# Patient Record
Sex: Female | Born: 1955 | Hispanic: No | Marital: Married | Smoking: Never smoker
Health system: Southern US, Community
[De-identification: ages and names within clinical notes are randomized; demographics above are authoritative.]

## PROBLEM LIST (undated history)

## (undated) DIAGNOSIS — I10 Essential (primary) hypertension: Secondary | ICD-10-CM

---

## 1998-02-03 ENCOUNTER — Other Ambulatory Visit: Admission: RE | Admit: 1998-02-03 | Discharge: 1998-02-03 | Payer: Self-pay | Admitting: *Deleted

## 2013-08-11 ENCOUNTER — Ambulatory Visit (INDEPENDENT_AMBULATORY_CARE_PROVIDER_SITE_OTHER): Payer: BC Managed Care – PPO | Admitting: Family Medicine

## 2013-08-11 VITALS — BP 198/102 | HR 74 | Temp 98.4°F | Resp 18 | Ht 62.5 in | Wt 126.0 lb

## 2013-08-11 DIAGNOSIS — E781 Pure hyperglyceridemia: Secondary | ICD-10-CM

## 2013-08-11 DIAGNOSIS — R82998 Other abnormal findings in urine: Secondary | ICD-10-CM

## 2013-08-11 DIAGNOSIS — I10 Essential (primary) hypertension: Secondary | ICD-10-CM

## 2013-08-11 DIAGNOSIS — D251 Intramural leiomyoma of uterus: Secondary | ICD-10-CM

## 2013-08-11 LAB — POCT UA - MICROSCOPIC ONLY
Casts, Ur, LPF, POC: NEGATIVE
Crystals, Ur, HPF, POC: NEGATIVE
Mucus, UA: NEGATIVE
Yeast, UA: NEGATIVE

## 2013-08-11 LAB — POCT URINALYSIS DIPSTICK
Bilirubin, UA: NEGATIVE
Blood, UA: NEGATIVE
Glucose, UA: NEGATIVE
Ketones, UA: NEGATIVE
Nitrite, UA: NEGATIVE
Protein, UA: NEGATIVE
Spec Grav, UA: 1.015
Urobilinogen, UA: 0.2
pH, UA: 7

## 2013-08-11 LAB — LIPID PANEL
Cholesterol: 208 mg/dL — ABNORMAL HIGH (ref 0–200)
HDL: 40 mg/dL (ref >39)
LDL Cholesterol: 113 mg/dL — ABNORMAL HIGH (ref 0–99)
Total CHOL/HDL Ratio: 5.2 Ratio
Triglycerides: 273 mg/dL — ABNORMAL HIGH (ref <150)
VLDL: 55 mg/dL — ABNORMAL HIGH (ref 0–40)

## 2013-08-11 LAB — COMPREHENSIVE METABOLIC PANEL
ALT: 21 U/L (ref 0–35)
AST: 22 U/L (ref 0–37)
Albumin: 4.5 g/dL (ref 3.5–5.2)
Alkaline Phosphatase: 78 U/L (ref 39–117)
BUN: 9 mg/dL (ref 6–23)
CO2: 26 mEq/L (ref 19–32)
Calcium: 8.7 mg/dL (ref 8.4–10.5)
Chloride: 104 mEq/L (ref 96–112)
Creat: 0.66 mg/dL (ref 0.50–1.10)
Glucose, Bld: 89 mg/dL (ref 70–99)
Potassium: 3.9 mEq/L (ref 3.5–5.3)
Sodium: 138 mEq/L (ref 135–145)
Total Bilirubin: 0.6 mg/dL (ref 0.2–1.2)
Total Protein: 7.7 g/dL (ref 6.0–8.3)

## 2013-08-11 LAB — POCT CBC
Granulocyte percent: 74.2 %G (ref 37–80)
HCT, POC: 40.4 % (ref 37.7–47.9)
Hemoglobin: 13 g/dL (ref 12.2–16.2)
Lymph, poc: 1 (ref 0.6–3.4)
MCH, POC: 29.4 pg (ref 27–31.2)
MCHC: 32.2 g/dL (ref 31.8–35.4)
MCV: 91.4 fL (ref 80–97)
MID (cbc): 0.3 (ref 0–0.9)
MPV: 9.5 fL (ref 0–99.8)
POC Granulocyte: 3.7 (ref 2–6.9)
POC LYMPH PERCENT: 20.2 %L (ref 10–50)
POC MID %: 5.6 %M (ref 0–12)
Platelet Count, POC: 276 10*3/uL (ref 142–424)
RBC: 4.42 M/uL (ref 4.04–5.48)
RDW, POC: 12.9 %
WBC: 5 10*3/uL (ref 4.6–10.2)

## 2013-08-11 LAB — TSH: TSH: 2.112 u[IU]/mL (ref 0.350–4.500)

## 2013-08-11 NOTE — Patient Instructions (Signed)
Hypertriglyceridemia  Diet for High blood levels of Triglycerides Most fats in food are triglycerides. Triglycerides in your blood are stored as fat in your body. High levels of triglycerides in your blood may put you at a greater risk for heart disease and stroke.  Normal triglyceride levels are less than 150 mg/dL. Borderline high levels are 150-199 mg/dl. High levels are 200 - 499 mg/dL, and very high triglyceride levels are greater than 500 mg/dL. The decision to treat high triglycerides is generally based on the level. For people with borderline or high triglyceride levels, treatment includes weight loss and exercise. Drugs are recommended for people with very high triglyceride levels. Many people who need treatment for high triglyceride levels have metabolic syndrome. This syndrome is a collection of disorders that often include: insulin resistance, high blood pressure, blood clotting problems, high cholesterol and triglycerides. TESTING PROCEDURE FOR TRIGLYCERIDES  You should not eat 4 hours before getting your triglycerides measured. The normal range of triglycerides is between 10 and 250 milligrams per deciliter (mg/dl). Some people may have extreme levels (1000 or above), but your triglyceride level may be too high if it is above 150 mg/dl, depending on what other risk factors you have for heart disease.  People with high blood triglycerides may also have high blood cholesterol levels. If you have high blood cholesterol as well as high blood triglycerides, your risk for heart disease is probably greater than if you only had high triglycerides. High blood cholesterol is one of the main risk factors for heart disease. CHANGING YOUR DIET  Your weight can affect your blood triglyceride level. If you are more than 20% above your ideal body weight, you may be able to lower your blood triglycerides by losing weight. Eating less and exercising regularly is the best way to combat this. Fat provides more  calories than any other food. The best way to lose weight is to eat less fat. Only 30% of your total calories should come from fat. Less than 7% of your diet should come from saturated fat. A diet low in fat and saturated fat is the same as a diet to decrease blood cholesterol. By eating a diet lower in fat, you may lose weight, lower your blood cholesterol, and lower your blood triglyceride level.  Eating a diet low in fat, especially saturated fat, may also help you lower your blood triglyceride level. Ask your dietitian to help you figure how much fat you can eat based on the number of calories your caregiver has prescribed for you.  Exercise, in addition to helping with weight loss may also help lower triglyceride levels.   Alcohol can increase blood triglycerides. You may need to stop drinking alcoholic beverages.  Too much carbohydrate in your diet may also increase your blood triglycerides. Some complex carbohydrates are necessary in your diet. These may include bread, rice, potatoes, other starchy vegetables and cereals.  Reduce "simple" carbohydrates. These may include pure sugars, candy, honey, and jelly without losing other nutrients. If you have the kind of high blood triglycerides that is affected by the amount of carbohydrates in your diet, you will need to eat less sugar and less high-sugar foods. Your caregiver can help you with this.  Adding 2-4 grams of fish oil (EPA+ DHA) may also help lower triglycerides. Speak with your caregiver before adding any supplements to your regimen. Following the Diet  Maintain your ideal weight. Your caregivers can help you with a diet. Generally, eating less food and getting more   exercise will help you lose weight. Joining a weight control group may also help. Ask your caregivers for a good weight control group in your area.  Eat low-fat foods instead of high-fat foods. This can help you lose weight too.  These foods are lower in fat. Eat MORE of these:    Dried beans, peas, and lentils.  Egg whites.  Low-fat cottage cheese.  Fish.  Lean cuts of meat, such as round, sirloin, rump, and flank (cut extra fat off meat you fix).  Whole grain breads, cereals and pasta.  Skim and nonfat dry milk.  Low-fat yogurt.  Poultry without the skin.  Cheese made with skim or part-skim milk, such as mozzarella, parmesan, farmers', ricotta, or pot cheese. These are higher fat foods. Eat LESS of these:   Whole milk and foods made from whole milk, such as American, blue, cheddar, monterey jack, and swiss cheese  High-fat meats, such as luncheon meats, sausages, knockwurst, bratwurst, hot dogs, ribs, corned beef, ground pork, and regular ground beef.  Fried foods. Limit saturated fats in your diet. Substituting unsaturated fat for saturated fat may decrease your blood triglyceride level. You will need to read package labels to know which products contain saturated fats.  These foods are high in saturated fat. Eat LESS of these:   Fried pork skins.  Whole milk.  Skin and fat from poultry.  Palm oil.  Butter.  Shortening.  Cream cheese.  Bacon.  Margarines and baked goods made from listed oils.  Vegetable shortenings.  Chitterlings.  Fat from meats.  Coconut oil.  Palm kernel oil.  Lard.  Cream.  Sour cream.  Fatback.  Coffee whiteners and non-dairy creamers made with these oils.  Cheese made from whole milk. Use unsaturated fats (both polyunsaturated and monounsaturated) moderately. Remember, even though unsaturated fats are better than saturated fats; you still want a diet low in total fat.  These foods are high in unsaturated fat:   Canola oil.  Sunflower oil.  Mayonnaise.  Almonds.  Peanuts.  Pine nuts.  Margarines made with these oils.  Safflower oil.  Olive oil.  Avocados.  Cashews.  Peanut butter.  Sunflower seeds.  Soybean oil.  Peanut  oil.  Olives.  Pecans.  Walnuts.  Pumpkin seeds. Avoid sugar and other high-sugar foods. This will decrease carbohydrates without decreasing other nutrients. Sugar in your food goes rapidly to your blood. When there is excess sugar in your blood, your liver may use it to make more triglycerides. Sugar also contains calories without other important nutrients.  Eat LESS of these:   Sugar, brown sugar, powdered sugar, jam, jelly, preserves, honey, syrup, molasses, pies, candy, cakes, cookies, frosting, pastries, colas, soft drinks, punches, fruit drinks, and regular gelatin.  Avoid alcohol. Alcohol, even more than sugar, may increase blood triglycerides. In addition, alcohol is high in calories and low in nutrients. Ask for sparkling water, or a diet soft drink instead of an alcoholic beverage. Suggestions for planning and preparing meals   Bake, broil, grill or roast meats instead of frying.  Remove fat from meats and skin from poultry before cooking.  Add spices, herbs, lemon juice or vinegar to vegetables instead of salt, rich sauces or gravies.  Use a non-stick skillet without fat or use no-stick sprays.  Cool and refrigerate stews and broth. Then remove the hardened fat floating on the surface before serving.  Refrigerate meat drippings and skim off fat to make low-fat gravies.  Serve more fish.  Use less butter,   margarine and other high-fat spreads on bread or vegetables.  Use skim or reconstituted non-fat dry milk for cooking.  Cook with low-fat cheeses.  Substitute low-fat yogurt or cottage cheese for all or part of the sour cream in recipes for sauces, dips or congealed salads.  Use half yogurt/half mayonnaise in salad recipes.  Substitute evaporated skim milk for cream. Evaporated skim milk or reconstituted non-fat dry milk can be whipped and substituted for whipped cream in certain recipes.  Choose fresh fruits for dessert instead of high-fat foods such as pies or  cakes. Fruits are naturally low in fat. When Dining Out   Order low-fat appetizers such as fruit or vegetable juice, pasta with vegetables or tomato sauce.  Select clear, rather than cream soups.  Ask that dressings and gravies be served on the side. Then use less of them.  Order foods that are baked, broiled, poached, steamed, stir-fried, or roasted.  Ask for margarine instead of butter, and use only a small amount.  Drink sparkling water, unsweetened tea or coffee, or diet soft drinks instead of alcohol or other sweet beverages. QUESTIONS AND ANSWERS ABOUT OTHER FATS IN THE BLOOD: SATURATED FAT, TRANS FAT, AND CHOLESTEROL What is trans fat? Trans fat is a type of fat that is formed when vegetable oil is hardened through a process called hydrogenation. This process helps makes foods more solid, gives them shape, and prolongs their shelf life. Trans fats are also called hydrogenated or partially hydrogenated oils.  What do saturated fat, trans fat, and cholesterol in foods have to do with heart disease? Saturated fat, trans fat, and cholesterol in the diet all raise the level of LDL "bad" cholesterol in the blood. The higher the LDL cholesterol, the greater the risk for coronary heart disease (CHD). Saturated fat and trans fat raise LDL similarly.  What foods contain saturated fat, trans fat, and cholesterol? High amounts of saturated fat are found in animal products, such as fatty cuts of meat, chicken skin, and full-fat dairy products like butter, whole milk, cream, and cheese, and in tropical vegetable oils such as palm, palm kernel, and coconut oil. Trans fat is found in some of the same foods as saturated fat, such as vegetable shortening, some margarines (especially hard or stick margarine), crackers, cookies, baked goods, fried foods, salad dressings, and other processed foods made with partially hydrogenated vegetable oils. Small amounts of trans fat also occur naturally in some animal  products, such as milk products, beef, and lamb. Foods high in cholesterol include liver, other organ meats, egg yolks, shrimp, and full-fat dairy products. How can I use the new food label to make heart-healthy food choices? Check the Nutrition Facts panel of the food label. Choose foods lower in saturated fat, trans fat, and cholesterol. For saturated fat and cholesterol, you can also use the Percent Daily Value (%DV): 5% DV or less is low, and 20% DV or more is high. (There is no %DV for trans fat.) Use the Nutrition Facts panel to choose foods low in saturated fat and cholesterol, and if the trans fat is not listed, read the ingredients and limit products that list shortening or hydrogenated or partially hydrogenated vegetable oil, which tend to be high in trans fat. POINTS TO REMEMBER:   Discuss your risk for heart disease with your caregivers, and take steps to reduce risk factors.  Change your diet. Choose foods that are low in saturated fat, trans fat, and cholesterol.  Add exercise to your daily routine if   it is not already being done. Participate in physical activity of moderate intensity, like brisk walking, for at least 30 minutes on most, and preferably all days of the week. No time? Break the 30 minutes into three, 10-minute segments during the day.  Stop smoking. If you do smoke, contact your caregiver to discuss ways in which they can help you quit.  Do not use street drugs.  Maintain a normal weight.  Maintain a healthy blood pressure.  Keep up with your blood work for checking the fats in your blood as directed by your caregiver. Document Released: 01/28/2004 Document Revised: 10/11/2011 Document Reviewed: 08/25/2008 [facility] Patient Information 2014 [place]. Uterine Fibroid A uterine fibroid is a growth (tumor) that occurs in your uterus. This type of tumor is not cancerous and does not spread out of the uterus. You can have one or many fibroids. Fibroids can vary in  size, weight, and where they grow in the uterus. Some can become quite large. Most fibroids do not require medical treatment, but some can cause pain or heavy bleeding during and between periods. CAUSES  A fibroid is the result of a single uterine cell that keeps growing (unregulated), which is different than most cells in the human body. Most cells have a control mechanism that keeps them from reproducing without control.  SIGNS AND SYMPTOMS   Bleeding.  Pelvic pain and pressure.  Bladder problems due to the size of the fibroid.  Infertility and miscarriages depending on the size and location of the fibroid. DIAGNOSIS  Uterine fibroids are diagnosed through a physical exam. Your health care provider may feel the lumpy tumors during a pelvic exam. Ultrasonography may be done to get information regarding size, location, and number of tumors.  TREATMENT   Your health care provider may recommend watchful waiting. This involves getting the fibroid checked by your health care provider to see if it grows or shrinks.   Hormone treatment or an intrauterine device (IUD) may be prescribed.   Surgery may be needed to remove the fibroids (myomectomy) or the uterus (hysterectomy). This depends on your situation. When fibroids interfere with fertility and a woman wants to become pregnant, a health care provider may recommend having the fibroids removed.  [place] care depends on how you were treated. In general:   Keep all follow-up appointments with your health care provider.   Only take over-the-counter or prescription medicines as directed by your health care provider. If you were prescribed a hormone treatment, take the hormone medicines exactly as directed. Do not take aspirin. It can cause bleeding.   Talk to your health care provider about taking iron pills.  If your periods are troublesome but not so heavy, lie down with your feet raised slightly above your heart.  Place cold packs on your lower abdomen.   If your periods are heavy, write down the number of pads or tampons you use per month. Bring this information to your health care provider.   Include green vegetables in your diet.  SEEK IMMEDIATE MEDICAL CARE IF:  You have pelvic pain or cramps not controlled with medicines.   You have a sudden increase in pelvic pain.   You have an increase in bleeding between and during periods.   You have excessive periods and soak tampons or pads in a half hour or less.  You feel lightheaded or have fainting episodes. Document Released: 04/08/2000 Document Revised: 01/30/2013 Document Reviewed: 11/08/2012 [facility] Patient Information 2014 [place], [place].  Hypertension As your heart beats, it forces blood through your arteries. This force is your blood pressure. If the pressure is too high, it is called hypertension (HTN) or high blood pressure. HTN is dangerous because you may have it and not know it. High blood pressure may mean that your heart has to work harder to pump blood. Your arteries may be narrow or stiff. The extra work puts you at risk for heart disease, stroke, and other problems.  Blood pressure consists of two numbers, a higher number over a lower, 110/72, for example. It is stated as "110 over 72." The ideal is below 120 for the top number (systolic) and under 80 for the bottom (diastolic). Write down your blood pressure today. You should pay close attention to your blood pressure if you have certain conditions such as:  Heart failure.  Prior heart attack.  Diabetes  Chronic kidney disease.  Prior stroke.  Multiple risk factors for heart disease. To see if you have HTN, your blood pressure should be measured while you are seated with your arm held at the level of the heart. It should be measured at least twice. A one-time elevated blood pressure reading (especially in the Emergency Department) does not mean that you need treatment.  There may be conditions in which the blood pressure is different between your right and left arms. It is important to see your caregiver soon for a recheck. Most people have essential hypertension which means that there is not a specific cause. This type of high blood pressure may be lowered by changing lifestyle factors such as:  Stress.  Smoking.  Lack of exercise.  Excessive weight.  Drug/tobacco/alcohol use.  Eating less salt. Most people do not have symptoms from high blood pressure until it has caused damage to the body. Effective treatment can often prevent, delay or reduce that damage. TREATMENT  When a cause has been identified, treatment for high blood pressure is directed at the cause. There are a large number of medications to treat HTN. These fall into several categories, and your caregiver will help you select the medicines that are best for you. Medications may have side effects. You should review side effects with your caregiver. If your blood pressure stays high after you have made lifestyle changes or started on medicines,   Your medication(s) may need to be changed.  Other problems may need to be addressed.  Be certain you understand your prescriptions, and know how and when to take your medicine.  Be sure to follow up with your caregiver within the time frame advised (usually within two weeks) to have your blood pressure rechecked and to review your medications.  If you are taking more than one medicine to lower your blood pressure, make sure you know how and at what times they should be taken. Taking two medicines at the same time can result in blood pressure that is too low. SEEK IMMEDIATE MEDICAL CARE IF:  You develop a severe headache, blurred or changing vision, or confusion.  You have unusual weakness or numbness, or a faint feeling.  You have severe chest or abdominal pain, vomiting, or breathing problems. MAKE SURE YOU:   Understand these  instructions.  Will watch your condition.  Will get help right away if you are not doing well or get worse. Document Released: 04/11/2005 Document Revised: 07/04/2011 Document Reviewed: 11/30/2007 [facility] Patient Information 2014 [place].

## 2013-08-11 NOTE — Progress Notes (Signed)
Is a 58 year old woman works in a [employer] business over in [place]. Went to a health screening at her [employer] and found that her blood pressure was high and her triglycerides are high.  Patient's had problems with anemia in the past which was attributed to fibroids. She no longer has periods now.  Patient is totally asymptomatic: No chest pain or shortness of breath, no swelling of her ankles.  Objective: HEENT unremarkable We have a language barrier and patient's preacher is helping to translate although his English is not perfect either Chest: Clear Heart: Regular with 1/6 systolic ejection type murmur Neck: Supple no thyromegaly or adenopathy, no bruits Abdomen: No bruits, fibroids are palpable to the level of the umbilicus and are irregular. There is no HSM. Present tenderness.  Extremities: No edema  Blood pressure recheck is 180/90. Patient brings in a number of blood pressure readings most of which are normal and are taken by herself at home. Nevertheless the screening at her church showed a blood pressure of 200/110  Assessment: Patient probably has hypertension. At this point is not having any symptoms and does not seem life-threatening so that we can wait on labs before we start putting her on medication.  Signed, Robyn Haber

## 2013-08-13 ENCOUNTER — Ambulatory Visit (INDEPENDENT_AMBULATORY_CARE_PROVIDER_SITE_OTHER): Payer: BC Managed Care – PPO | Admitting: Family Medicine

## 2013-08-13 VITALS — BP 162/100 | HR 71 | Temp 98.2°F | Resp 17 | Ht 63.0 in | Wt 123.0 lb

## 2013-08-13 DIAGNOSIS — IMO0001 Reserved for inherently not codable concepts without codable children: Secondary | ICD-10-CM

## 2013-08-13 DIAGNOSIS — R8281 Pyuria: Secondary | ICD-10-CM

## 2013-08-13 DIAGNOSIS — N390 Urinary tract infection, site not specified: Secondary | ICD-10-CM

## 2013-08-13 MED ORDER — CIPROFLOXACIN HCL 250 MG PO TABS: 250.0000 mg | ORAL_TABLET | Freq: Two times a day (BID) | ORAL | Status: DC

## 2013-08-13 NOTE — Patient Instructions (Signed)
Results for orders placed in visit on 08/11/13  COMPREHENSIVE METABOLIC PANEL      Result Value Ref Range   Sodium 138  135 - 145 mEq/L   Potassium 3.9  3.5 - 5.3 mEq/L   Chloride 104  96 - 112 mEq/L   CO2 26  19 - 32 mEq/L   Glucose, Bld 89  70 - 99 mg/dL   BUN 9  6 - 23 mg/dL   Creat 0.66  0.50 - 1.10 mg/dL   Total Bilirubin 0.6  0.2 - 1.2 mg/dL   Alkaline Phosphatase 78  39 - 117 U/L   AST 22  0 - 37 U/L   ALT 21  0 - 35 U/L   Total Protein 7.7  6.0 - 8.3 g/dL   Albumin 4.5  3.5 - 5.2 g/dL   Calcium 8.7  8.4 - 10.5 mg/dL  LIPID PANEL      Result Value Ref Range   Cholesterol 208 (*) 0 - 200 mg/dL   Triglycerides 273 (*) <150 mg/dL   HDL 40  >39 mg/dL   Total CHOL/HDL Ratio 5.2     VLDL 55 (*) 0 - 40 mg/dL   LDL Cholesterol 113 (*) 0 - 99 mg/dL  TSH      Result Value Ref Range   TSH 2.112  0.350 - 4.500 uIU/mL  POCT CBC      Result Value Ref Range   WBC 5.0  4.6 - 10.2 K/uL   Lymph, poc 1.0  0.6 - 3.4   POC LYMPH PERCENT 20.2  10 - 50 %L   MID (cbc) 0.3  0 - 0.9   POC MID % 5.6  0 - 12 %M   POC Granulocyte 3.7  2 - 6.9   Granulocyte percent 74.2  37 - 80 %G   RBC 4.42  4.04 - 5.48 M/uL   Hemoglobin 13.0  12.2 - 16.2 g/dL   HCT, POC 40.4  37.7 - 47.9 %   MCV 91.4  80 - 97 fL   MCH, POC 29.4  27 - 31.2 pg   MCHC 32.2  31.8 - 35.4 g/dL   RDW, POC 12.9     Platelet Count, POC 276  142 - 424 K/uL   MPV 9.5  0 - 99.8 fL  POCT UA - MICROSCOPIC ONLY      Result Value Ref Range   WBC, Ur, HPF, POC 3-13     RBC, urine, microscopic 0-2     Bacteria, U Microscopic trace     Mucus, UA neg     Epithelial cells, urine per micros 1-4     Crystals, Ur, HPF, POC neg     Casts, Ur, LPF, POC neg     Yeast, UA neg    POCT URINALYSIS DIPSTICK      Result Value Ref Range   Color, UA lt yellow     Clarity, UA clear     Glucose, UA neg     Bilirubin, UA neg     Ketones, UA neg     Spec Grav, UA 1.015     Blood, UA neg     pH, UA 7.0     Protein, UA neg     Urobilinogen, UA  0.2     Nitrite, UA neg     Leukocytes, UA small (1+)

## 2013-08-13 NOTE — Progress Notes (Addendum)
This chart was scribed for Robyn Haber, MD by Vernell Barrier, Medical Scribe. This patient's care was started at 8:26 PM.  Patient ID: Molly Richards MRN: [id number], DOB: [birth year], 58 y.o. Date of Encounter: 08/13/2013, 8:26 PM  Primary Physician: No primary provider on file.  Chief Complaint: HTN follow up  HPI: 58 y.o. year old female with history below presents for hypertension follow up. Seen by Dr. Joseph Art on 08/11/13 following a health screening. Cholesterol on blood test 2 days ago was slightly elevated but not high enough to require treatment.  BP today 162/100. Today says she feels okay. States she checked BP twice today and both times were in 476-546 range systolic.   Diet consists of brown rice and green vegetables. No CP, HA, visual changes, or focal deficits.   No past medical history on file.   Home Meds: Prior to Admission medications   Not on File    Allergies: No Known Allergies  History   Social History   Marital Status: Unknown    Spouse Name: N/A    Number of Children: N/A   Years of Education: N/A   Occupational History   Not on file.   Social History Main Topics   Smoking status: Never Smoker    Smokeless tobacco: Never Used   Alcohol Use: No   Drug Use: No   Sexual Activity: Not on file   Other Topics Concern   Not on file   Social History Narrative   No narrative on file     Family History  Problem Relation Age of Onset   Hypertension Mother     Review of Systems: Constitutional: negative for chills, fever, night sweats, weight changes, or fatigue  HEENT: negative for vision changes, hearing loss, congestion, rhinorrhea, ST, epistaxis, or sinus pressure Cardiovascular: negative for chest pain, palpitations, or DOE Respiratory: negative for hemoptysis, wheezing, shortness of breath, or cough Abdominal: negative for abdominal pain, nausea, vomiting, diarrhea, or constipation Dermatological: negative for rash Neurologic:  negative for headache, dizziness, or syncope All other systems reviewed and are otherwise negative with the exception to those above and in the HPI.   Physical Exam: Blood pressure 162/100, pulse 71, temperature 98.2 F (36.8 C), temperature source Oral, resp. rate 17, height 5\' 3"  (1.6 m), weight 123 lb (55.792 kg), SpO2 97.00%., Body mass index is 21.79 kg/(m^2). General: Well developed, well nourished, in no acute distress. Head: Normocephalic, atraumatic, eyes without discharge, sclera non-icteric, nares are without discharge. Bilateral auditory canals clear, TM's are without perforation, pearly grey and translucent with reflective cone of light bilaterally. Oral cavity moist, posterior pharynx without exudate, erythema, peritonsillar abscess, or post nasal drip.  Neck: Supple. No thyromegaly. Full ROM. No lymphadenopathy. No carotid bruits. Lungs: Clear bilaterally to auscultation without wheezes, rales, or rhonchi. Breathing is unlabored. Heart: RRR with S1 S2. No murmurs, rubs, or gallops appreciated.  Abdomen: Soft, non-tender, non-distended with normoactive bowel sounds. No hepatosplenomegaly. No rebound/guarding. No obvious abdominal masses. Msk:  Strength and tone normal for age. Extremities/Skin: Warm and dry. No clubbing or cyanosis. No edema. No rashes or suspicious lesions. Distal pulses 2+ and equal bilaterally. Neuro: Alert and oriented X 3. Moves all extremities spontaneously. Gait is normal. CNII-XII grossly in tact. DTR 2+, cerebellar function intact. Rhomberg normal. Psych:  Responds to questions appropriately with a normal affect.  8:35 PM: BP: 134/78     ASSESSMENT AND PLAN:  58 y.o. year old female with Pyuria - Plan: ciprofloxacin (CIPRO) 250 MG tablet  White coat hypertension  Recheck 4 months -  Signed, Robyn Haber, MD 08/13/2013 8:26 PM   .

## 2014-09-14 ENCOUNTER — Ambulatory Visit (INDEPENDENT_AMBULATORY_CARE_PROVIDER_SITE_OTHER): Payer: 59 | Admitting: Family Medicine

## 2014-09-14 VITALS — BP 142/80 | HR 67 | Temp 98.3°F | Resp 18 | Ht 63.25 in | Wt 130.8 lb

## 2014-09-14 DIAGNOSIS — D251 Intramural leiomyoma of uterus: Secondary | ICD-10-CM | POA: Diagnosis not present

## 2014-09-14 DIAGNOSIS — I10 Essential (primary) hypertension: Secondary | ICD-10-CM

## 2014-09-14 MED ORDER — LOSARTAN POTASSIUM 100 MG PO TABS
100.0000 mg | ORAL_TABLET | Freq: Every day | ORAL | Status: DC
Start: 2014-09-14 — End: 2015-10-05

## 2014-09-14 NOTE — Patient Instructions (Signed)

## 2014-09-14 NOTE — Progress Notes (Signed)
This a 59 year old married woman to a [place]. She has 2 problems. First problem is hypertension. She's been taking her sister's blood pressure medicine which has been dropping her pressure too low.  She recently went to a health screening clinic and had a cholesterol 214, blood sugar 143, normal BMI, and a blood pressure of 213/110.  Patient does note some occasional occipital headaches which are mild and tolerable. She's working long days with her husband.  Patient has also been diagnosed with a fibroid in the past. She's having some lower abdominal discomfort, particularly after she eats. She filling fullness down there as well.  Objective:BP 142/80 mmHg  Pulse 67  Temp(Src) 98.3 F (36.8 C) (Oral)  Resp 18  Ht 5' 3.25" (1.607 m)  Wt 130 lb 12.8 oz (59.33 kg)  BMI 22.97 kg/m2  SpO2 99% HEENT: Normal Neck: Supple no adenopathy or bruit Heart: Regular no murmur Chest: Clear  Assessment: Probable fibroid and hypertension.  Plan: Follow-up in 3 months, start losartan 1 her milligrams daily, GYN referral This chart was scribed in my presence and reviewed by me personally.    ICD-9-CM ICD-10-CM   1. Essential hypertension 401.9 I10 losartan (COZAAR) 100 MG tablet  2. Intramural leiomyoma of uterus 218.1 D25.1 Ambulatory referral to Gynecology     CANCELED: Ambulatory referral to Gynecology     Signed, Robyn Haber, MD

## 2015-10-05 ENCOUNTER — Other Ambulatory Visit: Payer: Self-pay | Admitting: Family Medicine

## 2016-02-03 ENCOUNTER — Encounter: Payer: Self-pay | Admitting: *Deleted

## 2016-02-23 ENCOUNTER — Emergency Department (HOSPITAL_COMMUNITY): Payer: BLUE CROSS/BLUE SHIELD

## 2016-02-23 ENCOUNTER — Encounter (HOSPITAL_COMMUNITY): Payer: Self-pay | Admitting: Emergency Medicine

## 2016-02-23 DIAGNOSIS — Z5321 Procedure and treatment not carried out due to patient leaving prior to being seen by health care provider: Secondary | ICD-10-CM | POA: Diagnosis not present

## 2016-02-23 DIAGNOSIS — R0989 Other specified symptoms and signs involving the circulatory and respiratory systems: Secondary | ICD-10-CM | POA: Diagnosis present

## 2016-02-23 MED ORDER — IOPAMIDOL (ISOVUE-300) INJECTION 61%
INTRAVENOUS | Status: AC
  Administered 2016-02-23: 75 mL
  Filled 2016-02-23: qty 75

## 2016-02-23 NOTE — ED Triage Notes (Signed)
Pt states two days ago she ate some fish and then a fish bone got stuck inside her throat. Pt states she tried to take it out, but unable to. C/o pain. No visible foreign body in throat. Pt able to swallow. In NAD.

## 2016-02-24 ENCOUNTER — Emergency Department (HOSPITAL_COMMUNITY)
Admission: EM | Admit: 2016-02-24 | Discharge: 2016-02-24 | Disposition: A | Discharge status: Home / Self Care | Payer: BLUE CROSS/BLUE SHIELD | Attending: Dermatology | Admitting: Dermatology

## 2016-02-27 ENCOUNTER — Ambulatory Visit (INDEPENDENT_AMBULATORY_CARE_PROVIDER_SITE_OTHER): Payer: BLUE CROSS/BLUE SHIELD | Admitting: Family Medicine

## 2016-02-27 VITALS — BP 140/100 | HR 59 | Temp 97.5°F | Resp 16 | Ht 63.0 in | Wt 128.8 lb

## 2016-02-27 DIAGNOSIS — I10 Essential (primary) hypertension: Secondary | ICD-10-CM

## 2016-02-27 DIAGNOSIS — J387 Other diseases of larynx: Secondary | ICD-10-CM | POA: Diagnosis not present

## 2016-02-27 DIAGNOSIS — Z5181 Encounter for therapeutic drug level monitoring: Secondary | ICD-10-CM | POA: Diagnosis not present

## 2016-02-27 LAB — POCT CBC
GRANULOCYTE PERCENT: 43.8 % (ref 37–80)
HCT, POC: 34.3 % — AB (ref 37.7–47.9)
Hemoglobin: 12 g/dL — AB (ref 12.2–16.2)
LYMPH, POC: 2.3 (ref 0.6–3.4)
MCH, POC: 30.5 pg (ref 27–31.2)
MCHC: 35.1 g/dL (ref 31.8–35.4)
MCV: 87 fL (ref 80–97)
MID (CBC): 0.4 (ref 0–0.9)
MPV: 8.5 fL (ref 0–99.8)
PLATELET COUNT, POC: 242 10*3/uL (ref 142–424)
POC Granulocyte: 2.1 (ref 2–6.9)
POC LYMPH %: 47.2 % (ref 10–50)
POC MID %: 9 %M (ref 0–12)
RBC: 3.94 M/uL — AB (ref 4.04–5.48)
RDW, POC: 12.4 %
WBC: 4.9 10*3/uL (ref 4.6–10.2)

## 2016-02-27 LAB — BASIC METABOLIC PANEL
BUN: 16 mg/dL (ref 7–25)
CALCIUM: 9.4 mg/dL (ref 8.6–10.4)
CO2: 29 mmol/L (ref 20–31)
Chloride: 102 mmol/L (ref 98–110)
Creat: 0.72 mg/dL (ref 0.50–1.05)
GLUCOSE: 82 mg/dL (ref 65–99)
POTASSIUM: 4.5 mmol/L (ref 3.5–5.3)
SODIUM: 138 mmol/L (ref 135–146)

## 2016-02-27 LAB — POCT RAPID STREP A (OFFICE): RAPID STREP A SCREEN: NEGATIVE

## 2016-02-27 MED ORDER — LOSARTAN POTASSIUM 100 MG PO TABS: ORAL_TABLET | ORAL | 3 refills | Status: DC

## 2016-02-27 MED ORDER — AMOXICILLIN-POT CLAVULANATE 875-125 MG PO TABS: 1.0000 | ORAL_TABLET | Freq: Two times a day (BID) | ORAL | 0 refills | Status: DC

## 2016-02-27 MED ORDER — LOSARTAN POTASSIUM 100 MG PO TABS: 50.0000 mg | ORAL_TABLET | Freq: Every day | ORAL | 3 refills | Status: AC

## 2016-02-27 NOTE — Progress Notes (Signed)
° °  Subjective:  By signing my name below, I, Molly Richards, attest that this documentation has been prepared under the direction and in the presence of Molly Cheadle, MD.  Electronically Signed: Thea Richards, ED Scribe. 02/27/2016. 4:04 PM.   Patient ID: Molly Richards, female    DOB: 1956/01/31, 60 y.o.   MRN: [id number]  HPI Chief Complaint  Patient presents with   Swallowed Foreign Body    Fish bone stuck in throat since Sunday, Had XRAY at [facility] ER nothing found   Medication Refill    Losartan. states she would like to decrease it in half as it causes headache    HPI Comments: Molly Richards is a 60 y.o. female who presents to the [facility] complaining of foreign body in throat. Pt was seen in the ED on 5 days ago complaining of foreign body of a fish bone in throat after eating fish 1 day prior; no provider note. CT scan showed movement so unable visualize foreign body, however did note a 69mm right middle lobe lung nodule. Pt tried removed foreign body with a qtip 5 days ago. The following day she developed left throat pain. Pt has tried to gurglingwithout relief.      There are no active problems to display for this patient.  No past medical history on file. Past Surgical History:  Procedure Laterality Date   CESAREAN SECTION     No Known Allergies Prior to Admission medications   Medication Sig Start Date End Date Taking? Authorizing Provider  losartan (COZAAR) 100 MG tablet TAKE 1 TABLET(100 MG) BY MOUTH DAILY 10/06/15  Yes Harrison Mons, PA-C   Social History   Social History   Marital status: Unknown    Spouse name: N/A   Number of children: N/A   Years of education: N/A   Occupational History   Not on file.   Social History Main Topics   Smoking status: Never Smoker   Smokeless tobacco: Never Used   Alcohol use No   Drug use: No   Sexual activity: Not on file   Other Topics Concern   Not on file   Social History Narrative   No  narrative on file   Review of Systems  HENT: Positive for sore throat and trouble swallowing ( pain).     Objective:   Physical Exam  Constitutional: She is oriented to person, place, and time. She appears well-developed and well-nourished. No distress.  HENT:  Left anterior tonsillar pillar with large 1 x 2 cm ulcer with exudate   Neck: Neck supple.  Cardiovascular: Normal rate.   Pulmonary/Chest: Effort normal.  Lymphadenopathy:       Head (left side): Tonsillar adenopathy present.    She has cervical adenopathy ( left).  Neurological: She is alert and oriented to person, place, and time.  Skin: Skin is warm. She is diaphoretic.  Psychiatric: She has a normal mood and affect. Her behavior is normal.  Nursing note and vitals reviewed.   Vitals:   02/27/16 1555  BP: (!) 190/100  Pulse: (!) 59  Resp: 16  Temp: 97.5 F (36.4 C)  TempSrc: Oral  SpO2: 98%  Weight: 128 lb 12.8 oz (58.4 kg)  Height: 5\' 3"  (1.6 m)   Assessment & Plan:

## 2016-02-27 NOTE — Patient Instructions (Addendum)
Peritonsillar Cellulitis Peritonsillar cellulitis is an infection around a tonsil that results in a severe sore throat. If the condition is not treated, pus can collect in the throat. CAUSES Peritonsillar cellulitis is usually caused by a combination of several strains of bacteria. RISK FACTORS This condition is more likely to develop in:  People who have frequent tonsil infections.  People who take antibiotic medicines frequently.  People who smoke. SYMPTOMS Early symptoms of this condition include:  A fever.  Chills.  Soreness on one side of the throat.  Pain in one ear.  Pain when swallowing.  Tiredness. Later symptoms include:  Severe pain when swallowing.  Drooling.  Trouble opening the mouth wide.  Bad breath.  Changes in the voice. DIAGNOSIS This condition may be diagnosed based on symptoms, a physical exam, and a test in which a sample of fluid from the throat is tested (throat culture). You may also have a blood test. TREATMENT This condition is usually treated with antibiotic medicines. You may need to take these medicines by mouth or through an IV tube. Additional treatment may include:  Medicines for pain, fever, or swelling. Some medicines may be given through an IV tube.  A procedure to drain a collection of pus (abscess).  Surgery to remove the tonsils (tonsillectomy). This may be done if you get this condition often. HOME CARE INSTRUCTIONS Eating and Drinking  If it is hard to swallow, try switching to a liquid or soft-food diet until you get better.  Drink enough fluid to keep your urine clear or pale yellow. Medicines  Take your antibiotic medicine as told by your health care provider. Do not stop taking the antibiotic even if you start to feel better.  Take over-the-counter and prescription medicines only as told by your health care provider. Activities  Rest and get plenty of sleep.  Return to work or school as directed by your health  care provider. General Instructions  Do not smoke.  Keep all follow-up visits as told by your health care provider. This is important.  To help ease pain and swelling, gargle with a salt-water mixture 3-4 times per day or as needed. To make a salt-water mixture, completely dissolve -1 tsp of salt in 1 cup of warm water. SEEK MEDICAL CARE IF:  Your swelling gets worse.  You have difficulty swallowing.  You are unable to take your antibiotic.  You have a fever that does not improve after you take medicine.  Your voice changes. SEEK IMMEDIATE MEDICAL CARE IF:  You have trouble breathing.  Your pain gets worse.  You see pus around or near your tonsils.  You cough up bloody spit.  You are unable to swallow.  You are drooling.   This information is not intended to replace advice given to you by your health care provider. Make sure you discuss any questions you have with your health care provider.   Document Released: 07/06/2009 Document Revised: 08/26/2014 Document Reviewed: 04/07/2014 Elsevier Interactive Patient Education 2016 [employer].    IF you received an x-ray today, you will receive an invoice from [facility]. Please contact [facility] at [phone] with questions or concerns regarding your invoice.   IF you received labwork today, you will receive an invoice from [employer]. Please contact [facility] at [phone] with questions or concerns regarding your invoice.   Our billing staff will not be able to assist you with questions regarding bills from these companies.  You will be contacted with the  lab results as soon as they are available. The fastest way to get your results is to activate your My Chart account. Instructions are located on the last page of this paperwork. If you have not heard from [place] regarding the results in 2 weeks, please contact this office.    Managing Your High Blood Pressure Blood  pressure is a measurement of how forceful your blood is pressing against the walls of the arteries. Arteries are muscular tubes within the circulatory system. Blood pressure does not stay the same. Blood pressure rises when you are active, excited, or nervous; and it lowers during sleep and relaxation. If the numbers measuring your blood pressure stay above normal most of the time, you are at risk for health problems. High blood pressure (hypertension) is a long-term (chronic) condition in which blood pressure is elevated. A blood pressure reading is recorded as two numbers, such as 120 over 80 (or 120/80). The first, higher number is called the systolic pressure. It is a measure of the pressure in your arteries as the heart beats. The second, lower number is called the diastolic pressure. It is a measure of the pressure in your arteries as the heart relaxes between beats.  Keeping your blood pressure in a normal range is important to your overall health and prevention of health problems, such as heart disease and stroke. When your blood pressure is uncontrolled, your heart has to work harder than normal. High blood pressure is a very common condition in adults because blood pressure tends to rise with age. Men and women are equally likely to have hypertension but at different times in life. Before age 64, men are more likely to have hypertension. After 59 years of age, women are more likely to have it. Hypertension is especially common in African Americans. This condition often has no signs or symptoms. The cause of the condition is usually not known. Your caregiver can help you come up with a plan to keep your blood pressure in a normal, healthy range. BLOOD PRESSURE STAGES Blood pressure is classified into four stages: normal, prehypertension, stage 1, and stage 2. Your blood pressure reading will be used to determine what type of treatment, if any, is necessary. Appropriate treatment options are tied to  these four stages:  Normal  Systolic pressure (mm Hg): below 120.  Diastolic pressure (mm Hg): below 80. Prehypertension  Systolic pressure (mm Hg): 120 to 139.  Diastolic pressure (mm Hg): 80 to 89. Stage1  Systolic pressure (mm Hg): 140 to 159.  Diastolic pressure (mm Hg): 90 to 99. Stage2  Systolic pressure (mm Hg): 160 or above.  Diastolic pressure (mm Hg): 100 or above. RISKS RELATED TO HIGH BLOOD PRESSURE Managing your blood pressure is an important responsibility. Uncontrolled high blood pressure can lead to:  A heart attack.  A stroke.  A weakened blood vessel (aneurysm).  Heart failure.  Kidney damage.  Eye damage.  Metabolic syndrome.  Memory and concentration problems. HOW TO MANAGE YOUR BLOOD PRESSURE Blood pressure can be managed effectively with lifestyle changes and medicines (if needed). Your caregiver will help you come up with a plan to bring your blood pressure within a normal range. Your plan should include the following: Education  Read all information provided by your caregivers about how to control blood pressure.  Educate yourself on the latest guidelines and treatment recommendations. New research is always being done to further define the risks and treatments for high blood pressure. Lifestylechanges  Control your weight.  Avoid smoking.  Stay physically active.  Reduce the amount of salt in your diet.  Reduce stress.  Control any chronic conditions, such as high cholesterol or diabetes.  Reduce your alcohol intake. Medicines  Several medicines (antihypertensive medicines) are available, if needed, to bring blood pressure within a normal range. Communication  Review all the medicines you take with your caregiver because there may be side effects or interactions.  Talk with your caregiver about your diet, exercise habits, and other lifestyle factors that may be contributing to high blood pressure.  See your caregiver  regularly. Your caregiver can help you create and adjust your plan for managing high blood pressure. RECOMMENDATIONS FOR TREATMENT AND FOLLOW-UP  The following recommendations are based on current guidelines for managing high blood pressure in nonpregnant adults. Use these recommendations to identify the proper follow-up period or treatment option based on your blood pressure reading. You can discuss these options with your caregiver.  Systolic pressure of 123456 to [id number] or diastolic pressure of 80 to 89: Follow up with your caregiver as directed.  Systolic pressure of [id number] to 0000000 or diastolic pressure of 90 to 100: Follow up with your caregiver within 2 months.  Systolic pressure above 0000000 or diastolic pressure above [id number]: Follow up with your caregiver within 1 month.  Systolic pressure above [id number] or diastolic pressure above [id number]: Consider antihypertensive therapy; follow up with your caregiver within 1 week.  Systolic pressure above [id number] or diastolic pressure above 123456: Begin antihypertensive therapy; follow up with your caregiver within 1 week.   This information is not intended to replace advice given to you by your health care provider. Make sure you discuss any questions you have with your health care provider.   Document Released: 01/04/2012 Document Reviewed: 01/04/2012 Elsevier Interactive Patient Education [employer].

## 2016-02-27 NOTE — Progress Notes (Signed)
Subjective:  By signing my name below, I, Raven Small, attest that this documentation has been prepared under the direction and in the presence of Delman Cheadle, MD.  Electronically Signed: Thea Alken, ED Scribe. 02/27/2016. 5:16 PM.   Patient ID: Molly Richards, female    DOB: 07-11-1955, 60 y.o.   MRN: [id number]  Swallowed Foreign Body  Associated symptoms include a sore throat and trouble swallowing ( pain). Pertinent negatives include no choking, cough, drooling, fever or wheezing.  Medication Refill  Associated symptoms include neck pain and a sore throat. Pertinent negatives include no chills, coughing, diaphoresis, fever or myalgias.   Chief Complaint  Patient presents with  . Swallowed Foreign Body    Fish bone stuck in throat since Sunday, Had XRAY at [facility] ER nothing found  . Medication Refill    Losartan. states she would like to decrease it in half as it causes headache    HPI Comments: Molly Richards is a 60 y.o. female who presents to the [facility] complaining of foreign body in throat. She is accompanied by her sister who helps translate. Pt was seen in the ED on 5 days ago complaining of foreign body of a fish bone in throat after eating fish 1 day prior; no provider note. CT scan showed movement so unable visualize foreign body, however did note a 74mm right middle lobe lung nodule. Pt tried removed foreign body with a qtip 5 days ago. The following day she developed left throat pain. Pt has tried to gargling vinegar and applying honey to the area which has helped some.  It hurts to swallow but she can and is not having problems eating, drinking, or taking medications.       Pt has been taking 1/2 of losartan 100mg  every evening for several years.  She checks her blood pressure regularly at home and is usually 100-110s/70-80s.  Her blood pressure does tend to increase in the evenings.   When she initially took losartan 100mg  she got headache, chest pain, back pain  and so did not want to restart it but a congregational nurse suggested she try 1/2 tab and that has worked for her since. Pt's sister notes that both of their BP always increases when at the doctors'.  There are no active problems to display for this patient.  No past medical history on file. Past Surgical History:  Procedure Laterality Date  . CESAREAN SECTION     No Known Allergies Prior to Admission medications   Medication Sig Start Date End Date Taking? Authorizing Provider  losartan (COZAAR) 100 MG tablet TAKE 1 TABLET(100 MG) BY MOUTH DAILY 10/06/15  Yes Harrison Mons, PA-C   Social History   Social History  . Marital status: Unknown    Spouse name: N/A  . Number of children: N/A  . Years of education: N/A   Occupational History  . Not on file.   Social History Main Topics  . Smoking status: Never Smoker  . Smokeless tobacco: Never Used  . Alcohol use No  . Drug use: No  . Sexual activity: Not on file   Other Topics Concern  . Not on file   Social History Narrative  . No narrative on file   Review of Systems  Constitutional: Negative for activity change, appetite change, chills, diaphoresis and fever.  HENT: Positive for mouth sores, sore throat and trouble swallowing ( pain). Negative for dental problem, drooling, ear pain, facial swelling, postnasal drip, rhinorrhea,  sinus pressure and voice change.   Respiratory: Negative for cough, choking, shortness of breath, wheezing and stridor.   Musculoskeletal: Positive for neck pain. Negative for myalgias and neck stiffness.  Allergic/Immunologic: Negative for food allergies and immunocompromised state.  Hematological: Positive for adenopathy.    Objective:   Physical Exam  Constitutional: She is oriented to person, place, and time. She appears well-developed and well-nourished. No distress.  HENT:  Head: Normocephalic and atraumatic.  Right Ear: External ear normal.  Left Ear: External ear normal.    Mouth/Throat: Uvula is midline. Oral lesions present. No trismus in the jaw. No uvula swelling. Oropharyngeal exudate, posterior oropharyngeal edema and posterior oropharyngeal erythema present. No tonsillar abscesses.  Left anterior tonsillar pillar with large 1 x 2 cm shallow ulcer with pale yellow exudate centrally and surrounding erythema  Eyes: Conjunctivae are normal. No scleral icterus.  Neck: Normal range of motion. Neck supple. No thyromegaly present.  Cardiovascular: Normal rate, regular rhythm, normal heart sounds and intact distal pulses.   Pulmonary/Chest: Effort normal and breath sounds normal. No respiratory distress.  Musculoskeletal: She exhibits no edema.  Lymphadenopathy:       Head (left side): Tonsillar adenopathy present.    She has cervical adenopathy ( left).  Neurological: She is alert and oriented to person, place, and time.  Skin: Skin is warm. She is diaphoretic. No erythema.  Psychiatric: She has a normal mood and affect. Her behavior is normal.  Nursing note and vitals reviewed.   Vitals:   02/27/16 1555 02/27/16 1640  BP: (!) 190/100 (!) 140/100  Pulse: (!) 59   Resp: 16   Temp: 97.5 F (36.4 C)   TempSrc: Oral   SpO2: 98%   Weight: 128 lb 12.8 oz (58.4 kg)   Height: 5\' 3"  (1.6 m)    Results for orders placed or performed in visit on 02/27/16  POCT CBC  Result Value Ref Range   WBC 4.9 4.6 - 10.2 K/uL   Lymph, poc 2.3 0.6 - 3.4   POC LYMPH PERCENT 47.2 10 - 50 %L   MID (cbc) 0.4 0 - 0.9   POC MID % 9.0 0 - 12 %M   POC Granulocyte 2.1 2 - 6.9   Granulocyte percent 43.8 37 - 80 %G   RBC 3.94 (A) 4.04 - 5.48 M/uL   Hemoglobin 12.0 (A) 12.2 - 16.2 g/dL   HCT, POC 34.3 (A) 37.7 - 47.9 %   MCV 87.0 80 - 97 fL   MCH, POC 30.5 27 - 31.2 pg   MCHC 35.1 31.8 - 35.4 g/dL   RDW, POC 12.4 %   Platelet Count, POC 242 142 - 424 K/uL   MPV 8.5 0 - 99.8 fL  POCT rapid strep A  Result Value Ref Range   Rapid Strep A Screen Negative Negative     Assessment & Plan:   1. Throat ulcer - clx P.  Start augmentin.  Recheck in 2d.  Advised ENT referral but pt declines - really doesn't want to miss work and is doing relatively well.  Offered lidocaine solution for topical application or gargling but pt declined.  Cont salt water gargles.  2. Essential hypertension, benign - I suspect pt has white-coat HTN, bp outside office well controlled on losartan 50mg  - offered to lower mg of pill but pt wants to keep cutting the 100mg  pill in half.  3. Medication monitoring encounter     Orders Placed This Encounter  Procedures  . WOUND  CULTURE    Order Specific Question:   Source    Answer:   oropharynx left tonsil  . Basic metabolic panel    Order Specific Question:   Has the patient fasted?    Answer:   No  . POCT CBC  . POCT rapid strep A    Meds ordered this encounter  Medications  . losartan (COZAAR) 100 MG tablet    Sig: TAKE 1 TABLET(100 MG) BY MOUTH DAILY    Dispense:  90 tablet    Refill:  3  . amoxicillin-clavulanate (AUGMENTIN) 875-125 MG tablet    Sig: Take 1 tablet by mouth 2 (two) times daily.    Dispense:  20 tablet    Refill:  0    I personally performed the services described in this documentation, which was scribed in my presence. The recorded information has been reviewed and considered, and addended by me as needed.   Delman Cheadle, M.D.  [facility] [address] [place], [place] [address] [phone] phone [phone] fax  02/27/16 5:16 PM

## 2016-02-28 ENCOUNTER — Encounter: Payer: Self-pay | Admitting: Family Medicine

## 2016-02-29 ENCOUNTER — Ambulatory Visit (INDEPENDENT_AMBULATORY_CARE_PROVIDER_SITE_OTHER): Payer: BLUE CROSS/BLUE SHIELD | Admitting: Family Medicine

## 2016-02-29 VITALS — BP 146/88 | HR 65 | Temp 97.8°F | Resp 17 | Ht 63.0 in | Wt 128.0 lb

## 2016-02-29 DIAGNOSIS — J387 Other diseases of larynx: Secondary | ICD-10-CM | POA: Diagnosis not present

## 2016-02-29 DIAGNOSIS — I10 Essential (primary) hypertension: Secondary | ICD-10-CM

## 2016-02-29 DIAGNOSIS — Z5181 Encounter for therapeutic drug level monitoring: Secondary | ICD-10-CM

## 2016-02-29 LAB — WOUND CULTURE
GRAM STAIN: NONE SEEN
Organism ID, Bacteria: NORMAL

## 2016-02-29 MED ORDER — NYSTATIN 100000 UNIT/ML MT SUSP: 5.0000 mL | Freq: Four times a day (QID) | OROMUCOSAL | 0 refills | Status: DC

## 2016-02-29 NOTE — Progress Notes (Signed)
  Chief Complaint  Patient presents with  . Follow-up    throat ulcer    HPI Hypertension- chronic She reports that this morning her systolic bp was 123456 with diastolic of 70 which is typical for her.  She always gets higher blood pressure in the clinic.  She denies chest pains, palpitations or shortness of breath  Mouth Ulcer- follow up She reports that she was seen on 02/27/16 for throat and was given Augmentin. She reports that the ulcer is feeling better.  Reports that she is gargling with salt water.  She stopped gargling with vinegar.     No past medical history on file.  Current Outpatient Prescriptions  Medication Sig Dispense Refill  . amoxicillin-clavulanate (AUGMENTIN) 875-125 MG tablet Take 1 tablet by mouth 2 (two) times daily. 20 tablet 0  . losartan (COZAAR) 100 MG tablet Take 0.5 tablets (50 mg total) by mouth daily. 90 tablet 3  . nystatin (MYCOSTATIN) 100000 UNIT/ML suspension Use as directed 5 mLs (500,000 Units total) in the mouth or throat 4 (four) times daily. Use as a gargle in mouth 4 times daily 60 mL 0   No current facility-administered medications for this visit.     Allergies: No Known Allergies  Past Surgical History:  Procedure Laterality Date  . CESAREAN SECTION      Social History   Social History  . Marital status: Unknown    Spouse name: N/A  . Number of children: N/A  . Years of education: N/A   Social History Main Topics  . Smoking status: Never Smoker  . Smokeless tobacco: Never Used  . Alcohol use No  . Drug use: No  . Sexual activity: Not Asked   Other Topics Concern  . None   Social History Narrative  . None    ROS  Objective: Vitals:   02/29/16 1731  BP: (!) 146/88  Pulse: 65  Resp: 17  Temp: 97.8 F (36.6 C)  TempSrc: Oral  SpO2: 99%  Weight: 128 lb (58.1 kg)  Height: 5\' 3"  (1.6 m)    Physical Exam  Constitutional: She is oriented to person, place, and time. She appears well-developed and  well-nourished.  HENT:  Head: Normocephalic and atraumatic.  visible clean based ulcer in the upper palette  No pharyngeal exudate No lymphedema  Eyes: Conjunctivae and EOM are normal.  Cardiovascular: Normal rate, regular rhythm and normal heart sounds.   No murmur heard. Pulmonary/Chest: Effort normal and breath sounds normal. No respiratory distress. She has no wheezes.  Neurological: She is alert and oriented to person, place, and time.      Assessment and Plan Ash[place]inn was seen today for follow-up.  Diagnoses and all orders for this visit:  Throat ulcer- Since pt is taking antibiotic she has a risk of thrush Given her ulcer is in the oral cavity and she does not want the magic mouthwash due to the lidocaine which she hates then will give her nystatin gargle to use with her augmentin -     nystatin (MYCOSTATIN) 100000 UNIT/ML suspension; Use as directed 5 mLs (500,000 Units total) in the mouth or throat 4 (four) times daily. Use as a gargle in mouth 4 times daily  Essential hypertension, benign- white coat hypertension but typically pt is well controlled  Medication monitoring encounter- reviewed all meds  Pt should continue her current meds  Return to clinic in one week for re-evaluation      [place]

## 2016-02-29 NOTE — Patient Instructions (Addendum)
Continue Augmentin Return to clinic in 10 days after completing the medication If it is not healing we recommend a laryngoscopy with the [facility] doctor.    IF you received an x-ray today, you will receive an invoice from [facility]. Please contact [facility] at [phone] with questions or concerns regarding your invoice.   IF you received labwork today, you will receive an invoice from [employer]. Please contact [facility] at [phone] with questions or concerns regarding your invoice.   Our billing staff will not be able to assist you with questions regarding bills from these companies.  You will be contacted with the lab results as soon as they are available. The fastest way to get your results is to activate your My Chart account. Instructions are located on the last page of this paperwork. If you have not heard from [place] regarding the results in 2 weeks, please contact this office.     We recommend that you schedule a mammogram for breast cancer screening. Typically, you do not need a referral to do this. Please contact a local imaging center to schedule your mammogram.  [facility] - [phone]  *ask for the Radiology Department [facility] - [phone] or [phone]  [facility] - [phone] [place] [phone] [facility] - [phone]  *ask for the [facility] - [phone]  *ask for the Radiology Department [facility] - [phone]  *ask for the [facility] - [phone] Laryngoscopy A laryngoscopy is a procedure performed to view the back of the throat, vocal cords, and voice box (larynx). It may be done in order to figure out why a person has:  A cough.  Voice changes, such as new hoarseness.  Throat pain.  Problems swallowing. During  a laryngoscopy, tissue samples (biopsies) can be taken or foreign bodies can be removed. A laryngoscopy can be done in the caregiver's office. It may be performed with a hand-held mirror, or it may require the use of a fiberoptic scope. Under some circumstances, it may be done in a hospital with medicine to help you sleep (general anesthesia). LET YOUR CAREGIVER KNOW ABOUT:   Allergies.  Medicines taken, including herbs, eyedrops, over-the-counter medicines, and creams.  Use of steroids (by mouth or creams).  Previous problems with anesthetics or numbing medicines.  History of bleeding or blood problems.  History of blood clots.  Possibility of pregnancy, if this applies.  Previous surgery.  Other health problems. RISKS AND COMPLICATIONS   Pain.  Gagging.  Vomiting.  Swelling.  Bleeding.  Problems from anesthesia. BEFORE THE PROCEDURE  Several days before the procedure, you may have blood tests to make sure the blood clots normally. You may be asked to stop taking blood thinners, aspirin, and/or nonsteroidal anti-inflammatory drugs (NSAIDs) before the procedure. Do not give aspirin to children. If general anesthesia is going to be used, you will usually be asked to stop eating and drinking at least 8 hours before the procedure. Have someone go with you to the procedure in order to drive you home afterward. PROCEDURE  If you are having the procedure in the caregiver's office, it is usually performed while sitting up in a special exam chair with a headrest. A numbing medicine will be sprayed into the mouth and on the back of the throat. Your caregiver will use a gauze pad to hold the  tongue out of the way. A mirror will be held at the back of the throat to allow your caregiver to see down the throat. You may be asked to make certain sounds so that vocal cord movement can be observed. If a fiberoptic scope is being used, it will be inserted into either the nose or the mouth and  slipped into the throat. Your caregiver can look through an eyepiece or can see an image projected on a monitor. Pieces of tissue can be taken (biopsies) or foreign bodies removed. If you need to have the procedure performed under general anesthesia, you will be lying down on a special operating table. The same kinds of procedures will be followed, but you will not be aware of them. AFTER THE PROCEDURE   When laryngoscopy is done with only local numbing, it usually does not require any changes to your activity level after the procedure is complete.  You may have a sore throat.  You may be asked to rest the voice for some length of time after the procedure.  Do not smoke.  Follow your caregiver's directions regarding eating and drinking after the procedure.  If you had a biopsy taken, your caregiver may advise trying to avoid coughing, whispering, or clearing the throat.  If you were given a general anesthetic or a medicine to help you relax (sedative), you will be sleepy.  There may be some pain or you may feel sick to your stomach (nauseous), but this can usually be controlled with medicines taken by mouth.  You will stay in the recovery room until awake and able to drink fluids.  You can go back to his or her usual level of activity within several days. HOME CARE INSTRUCTIONS   Take all medicines exactly as directed.  Follow any prescribed diet.  Follow instructions regarding rest (including voice rest) and physical activity.  Follow instructions for the use of throat lozenges or gargles. SEEK IMMEDIATE MEDICAL CARE IF:   You have severe pain.  You have new bleeding during coughing, spitting, or vomiting.  You develop nausea and vomiting.  You develop new problems with swallowing.  You have difficulty breathing or have shortness of breath.  You have chest pain.  Your voice changes.  You have a fever.  You develop a cough. MAKE SURE YOU:   Understand these  instructions.  Will watch your condition.  Will get help right away if you are not doing well or get worse.   This information is not intended to replace advice given to you by your health care provider. Make sure you discuss any questions you have with your health care provider.   Document Released: 07/06/2009 Document Revised: 05/02/2014 Document Reviewed: 07/06/2009 Elsevier Interactive Patient Education [employer].

## 2017-01-08 ENCOUNTER — Encounter: Payer: Self-pay | Admitting: *Deleted

## 2017-01-08 DIAGNOSIS — Z23 Encounter for immunization: Secondary | ICD-10-CM

## 2018-01-30 ENCOUNTER — Encounter: Payer: Self-pay | Admitting: *Deleted

## 2018-10-24 HISTORY — PX: TOTAL ABDOMINAL HYSTERECTOMY: SHX209

## 2019-12-29 ENCOUNTER — Encounter: Payer: Self-pay | Admitting: *Deleted

## 2020-04-25 ENCOUNTER — Other Ambulatory Visit: Payer: Self-pay

## 2020-04-25 ENCOUNTER — Inpatient Hospital Stay (HOSPITAL_COMMUNITY)
Admission: EM | Admit: 2020-04-25 | Discharge: 2020-04-27 | DRG: 065 | Disposition: A | Discharge status: Home / Self Care | Payer: BLUE CROSS/BLUE SHIELD | Attending: Internal Medicine | Admitting: Internal Medicine

## 2020-04-25 DIAGNOSIS — R29700 NIHSS score 0: Secondary | ICD-10-CM | POA: Diagnosis present

## 2020-04-25 DIAGNOSIS — R471 Dysarthria and anarthria: Secondary | ICD-10-CM | POA: Diagnosis present

## 2020-04-25 DIAGNOSIS — I6381 Other cerebral infarction due to occlusion or stenosis of small artery: Secondary | ICD-10-CM | POA: Diagnosis not present

## 2020-04-25 DIAGNOSIS — I10 Essential (primary) hypertension: Secondary | ICD-10-CM | POA: Diagnosis present

## 2020-04-25 DIAGNOSIS — E785 Hyperlipidemia, unspecified: Secondary | ICD-10-CM | POA: Diagnosis present

## 2020-04-25 DIAGNOSIS — R531 Weakness: Secondary | ICD-10-CM

## 2020-04-25 DIAGNOSIS — E876 Hypokalemia: Secondary | ICD-10-CM | POA: Diagnosis present

## 2020-04-25 DIAGNOSIS — I639 Cerebral infarction, unspecified: Secondary | ICD-10-CM | POA: Diagnosis present

## 2020-04-25 DIAGNOSIS — Z79899 Other long term (current) drug therapy: Secondary | ICD-10-CM

## 2020-04-25 DIAGNOSIS — Z90710 Acquired absence of both cervix and uterus: Secondary | ICD-10-CM

## 2020-04-25 DIAGNOSIS — G8194 Hemiplegia, unspecified affecting left nondominant side: Secondary | ICD-10-CM | POA: Diagnosis present

## 2020-04-25 DIAGNOSIS — Z20822 Contact with and (suspected) exposure to covid-19: Secondary | ICD-10-CM | POA: Diagnosis present

## 2020-04-25 HISTORY — DX: Essential (primary) hypertension: I10

## 2020-04-25 MED ORDER — SODIUM CHLORIDE 0.9% FLUSH: 3.0000 mL | Freq: Once | INTRAVENOUS | Status: DC

## 2020-04-25 NOTE — ED Triage Notes (Addendum)
Pt presents to ED POV. Pt c/o L weakness in arm and leg. LKW - 1500. Weakness was onset suddenly and pt felt a little dizzy initially. And some lower back discomfort. Pt reports arm has improved some. L leg has slight drift to gravity but pt is able to keep it elevated.

## 2020-04-26 ENCOUNTER — Inpatient Hospital Stay (HOSPITAL_COMMUNITY): Payer: BLUE CROSS/BLUE SHIELD

## 2020-04-26 ENCOUNTER — Emergency Department (HOSPITAL_COMMUNITY): Payer: BLUE CROSS/BLUE SHIELD

## 2020-04-26 ENCOUNTER — Encounter (HOSPITAL_COMMUNITY): Payer: Self-pay | Admitting: Internal Medicine

## 2020-04-26 DIAGNOSIS — E876 Hypokalemia: Secondary | ICD-10-CM | POA: Diagnosis present

## 2020-04-26 DIAGNOSIS — I6381 Other cerebral infarction due to occlusion or stenosis of small artery: Secondary | ICD-10-CM

## 2020-04-26 DIAGNOSIS — Z79899 Other long term (current) drug therapy: Secondary | ICD-10-CM | POA: Diagnosis not present

## 2020-04-26 DIAGNOSIS — R29700 NIHSS score 0: Secondary | ICD-10-CM | POA: Diagnosis present

## 2020-04-26 DIAGNOSIS — R471 Dysarthria and anarthria: Secondary | ICD-10-CM | POA: Diagnosis present

## 2020-04-26 DIAGNOSIS — R531 Weakness: Secondary | ICD-10-CM | POA: Diagnosis not present

## 2020-04-26 DIAGNOSIS — Z20822 Contact with and (suspected) exposure to covid-19: Secondary | ICD-10-CM | POA: Diagnosis present

## 2020-04-26 DIAGNOSIS — I6389 Other cerebral infarction: Secondary | ICD-10-CM | POA: Diagnosis not present

## 2020-04-26 DIAGNOSIS — E785 Hyperlipidemia, unspecified: Secondary | ICD-10-CM | POA: Diagnosis present

## 2020-04-26 DIAGNOSIS — Z90710 Acquired absence of both cervix and uterus: Secondary | ICD-10-CM | POA: Diagnosis not present

## 2020-04-26 DIAGNOSIS — Z8679 Personal history of other diseases of the circulatory system: Secondary | ICD-10-CM | POA: Diagnosis not present

## 2020-04-26 DIAGNOSIS — I639 Cerebral infarction, unspecified: Secondary | ICD-10-CM | POA: Diagnosis present

## 2020-04-26 DIAGNOSIS — I6300 Cerebral infarction due to thrombosis of unspecified precerebral artery: Secondary | ICD-10-CM | POA: Diagnosis not present

## 2020-04-26 DIAGNOSIS — I10 Essential (primary) hypertension: Secondary | ICD-10-CM | POA: Diagnosis present

## 2020-04-26 DIAGNOSIS — G8194 Hemiplegia, unspecified affecting left nondominant side: Secondary | ICD-10-CM | POA: Diagnosis present

## 2020-04-26 LAB — CBC
HCT: 36.3 % (ref 36.0–46.0)
Hemoglobin: 12.4 g/dL (ref 12.0–15.0)
MCH: 30.2 pg (ref 26.0–34.0)
MCHC: 34.2 g/dL (ref 30.0–36.0)
MCV: 88.5 fL (ref 80.0–100.0)
Platelets: 295 10*3/uL (ref 150–400)
RBC: 4.1 MIL/uL (ref 3.87–5.11)
RDW: 11.7 % (ref 11.5–15.5)
WBC: 8.3 10*3/uL (ref 4.0–10.5)
nRBC: 0 % (ref 0.0–0.2)

## 2020-04-26 LAB — DIFFERENTIAL
Abs Immature Granulocytes: 0.02 10*3/uL (ref 0.00–0.07)
Basophils Absolute: 0 10*3/uL (ref 0.0–0.1)
Basophils Relative: 1 %
Eosinophils Absolute: 0.1 10*3/uL (ref 0.0–0.5)
Eosinophils Relative: 2 %
Immature Granulocytes: 0 %
Lymphocytes Relative: 43 %
Lymphs Abs: 3.6 10*3/uL (ref 0.7–4.0)
Monocytes Absolute: 0.9 10*3/uL (ref 0.1–1.0)
Monocytes Relative: 11 %
Neutro Abs: 3.7 10*3/uL (ref 1.7–7.7)
Neutrophils Relative %: 43 %

## 2020-04-26 LAB — LIPID PANEL
Cholesterol: 232 mg/dL — ABNORMAL HIGH (ref 0–200)
HDL: 55 mg/dL (ref >40)
LDL Cholesterol: 154 mg/dL — ABNORMAL HIGH (ref 0–99)
Total CHOL/HDL Ratio: 4.2 RATIO
Triglycerides: 116 mg/dL (ref <150)
VLDL: 23 mg/dL (ref 0–40)

## 2020-04-26 LAB — HEMOGLOBIN A1C
Hgb A1c MFr Bld: 5.2 % (ref 4.8–5.6)
Mean Plasma Glucose: 102.54 mg/dL

## 2020-04-26 LAB — COMPREHENSIVE METABOLIC PANEL
ALT: 19 U/L (ref 0–44)
AST: 19 U/L (ref 15–41)
Albumin: 4.1 g/dL (ref 3.5–5.0)
Alkaline Phosphatase: 102 U/L (ref 38–126)
Anion gap: 8 (ref 5–15)
BUN: 11 mg/dL (ref 8–23)
CO2: 25 mmol/L (ref 22–32)
Calcium: 8.9 mg/dL (ref 8.9–10.3)
Chloride: 106 mmol/L (ref 98–111)
Creatinine, Ser: 0.72 mg/dL (ref 0.44–1.00)
GFR, Estimated: >60 mL/min (ref >60)
Glucose, Bld: 103 mg/dL — ABNORMAL HIGH (ref 70–99)
Potassium: 3.8 mmol/L (ref 3.5–5.1)
Sodium: 139 mmol/L (ref 135–145)
Total Bilirubin: 0.8 mg/dL (ref 0.3–1.2)
Total Protein: 8 g/dL (ref 6.5–8.1)

## 2020-04-26 LAB — I-STAT CHEM 8, ED
BUN: 16 mg/dL (ref 8–23)
Calcium, Ion: 1.12 mmol/L — ABNORMAL LOW (ref 1.15–1.40)
Chloride: 104 mmol/L (ref 98–111)
Creatinine, Ser: 0.7 mg/dL (ref 0.44–1.00)
Glucose, Bld: 100 mg/dL — ABNORMAL HIGH (ref 70–99)
HCT: 38 % (ref 36.0–46.0)
Hemoglobin: 12.9 g/dL (ref 12.0–15.0)
Potassium: 3.9 mmol/L (ref 3.5–5.1)
Sodium: 143 mmol/L (ref 135–145)
TCO2: 25 mmol/L (ref 22–32)

## 2020-04-26 LAB — PROTIME-INR
INR: 1 (ref 0.8–1.2)
Prothrombin Time: 12.4 seconds (ref 11.4–15.2)

## 2020-04-26 LAB — HIV ANTIBODY (ROUTINE TESTING W REFLEX): HIV Screen 4th Generation wRfx: NONREACTIVE

## 2020-04-26 LAB — APTT: aPTT: 29 seconds (ref 24–36)

## 2020-04-26 LAB — SARS CORONAVIRUS 2 (TAT 6-24 HRS): SARS Coronavirus 2: NEGATIVE

## 2020-04-26 MED ORDER — ASPIRIN 81 MG PO CHEW
81.0000 mg | CHEWABLE_TABLET | Freq: Every day | ORAL | Status: DC
  Administered 2020-04-27: 81 mg via ORAL
  Filled 2020-04-26: qty 1

## 2020-04-26 NOTE — H&P (Signed)
Date: 04/26/2020               Patient Name:  Molly Richards MRN: :[id number]  DOB: 1955-10-27 Age / Sex: 65 y.o., female   PCP: [name], MD         Medical Service: Internal Medicine Teaching Service         Attending Physician: Dr. Joni Reining MD     First Contact: Dr. Johnney Ou Pager: [phone]  Second Contact: Dr. Gilford Rile Pager: [phone]       After Hours (After 5p/  First Contact Pager: [phone]  weekends / holidays): Second Contact Pager: [phone]   Chief Complaint: Weakness  History of Present Illness:   Molly Richards is a 65 y/o F with a PMHx of HTN presents to the ED with dysarthria, left upper and lower extremity weakness as well as gait abnormalities that onset yesterday at approximately 1500. Patient was carrying dishes when she noticed difficulty walking and difficulty with left hand strength. She states the difficulty walking was in her left leg only and it felt as though "she was walking on air." Her son noticed the patient having a slurred her speech, but is currently back at baseline. Patient denies numbness and tingling. Prior to the above event, she had an episode of dizziness, but was otherwise in her usual state of health.  Denies any visual changes, difficulty swallowing, chest pain, fluttering in chest/racing heart beat, shortness of breath. Denies any recent falls or episodes of LOC. Denies history of similar episodes. Patient's baseline is able to ambulate without assistance and perform all of her ADL's independently.  Above information translated by patient's son, preferred language is [place].    ED Course: CT head w/o C unremarkable. MRI brain w/o contrast revealed small acute lacunar infarct in posterior right corona radiata. Stroke  Lab Orders     SARS CORONAVIRUS 2 (TAT 6-24 HRS) Nasopharyngeal Nasopharyngeal Swab     Protime-INR     APTT     CBC     Differential     Comprehensive metabolic panel     HIV Antibody (routine testing w rflx)      Comprehensive metabolic panel     Lipid panel     Hemoglobin A1c     I-stat chem 8, ED     CBG monitoring, ED   Meds:  Current Meds  Medication Sig  . amLODipine (NORVASC) 5 MG tablet Take 5 mg by mouth daily.  Marland Kitchen ascorbic acid (VITAMIN C) 100 MG tablet Take 100 mg by mouth daily.  . Cholecalciferol 25 MCG (1000 UT) tablet Take 1,000 Units by mouth daily.    Social:  Lives at home, works at a [employer] No tobacco No ETOH No drugs  Family History:  Cardiovascular: None HTN: HTN siblings Diabetes: None Cancer: Colon cancer (father at 54)  Allergies: Allergies as of 04/25/2020 - Review Complete 03/01/2016  Allergen Reaction Noted  . Sulfa antibiotics Rash 10/24/2016   PMH: Hypertesion  PSH:  2x c section 2021 total hysterectomy  Lumpectomy: benign  Review of Systems: A complete ROS was negative except as per HPI.   Physical Exam: Blood pressure 130/70, pulse 60, temperature (!) 97.4 F (36.3 C), temperature source Oral, resp. rate 15, SpO2 97 %. Physical Exam Vitals and nursing note reviewed.  Constitutional:      General: She is not in acute distress.    Appearance: Normal appearance. She is normal weight. She is not ill-appearing or toxic-appearing.  HENT:     Head: Normocephalic and atraumatic.  Eyes:     General: No visual field deficit.    Extraocular Movements: Extraocular movements intact.     Pupils: Pupils are equal, round, and reactive to light.  Cardiovascular:     Rate and Rhythm: Normal rate and regular rhythm.     Pulses: Normal pulses.     Heart sounds: Normal heart sounds. No murmur heard. No friction rub. No gallop.   Pulmonary:     Effort: Pulmonary effort is normal. No respiratory distress.     Breath sounds: Normal breath sounds. No stridor. No wheezing, rhonchi or rales.  Abdominal:     General: Abdomen is flat.     Palpations: Abdomen is soft.     Tenderness: There is no abdominal tenderness. There is no guarding or  rebound.  Musculoskeletal:     Right lower leg: No edema.     Left lower leg: No edema.  Skin:    General: Skin is warm and dry.  Neurological:     Mental Status: She is alert and oriented to person, place, and time.     Cranial Nerves: No facial asymmetry.     Sensory: Sensation is intact. No sensory deficit.     Motor: Weakness present. No tremor.     Coordination: Finger-Nose-Finger Test and Heel to [place] Test normal.     Comments: PEERL, facial sensation in tact. No uvular deviation. Left upper extremity shoulder and grip strength 4/5. Lower extremity strength 4/5. Normal sensation throughout.   Gait not assessed at this time  Psychiatric:        Mood and Affect: Mood normal.        Behavior: Behavior normal.    Labs: CBC    Component Value Date/Time   WBC 8.3 04/26/2020 0012   RBC 4.10 04/26/2020 0012   HGB 12.9 04/26/2020 0019   HCT 38.0 04/26/2020 0019   PLT 295 04/26/2020 0012   MCV 88.5 04/26/2020 0012   MCV 87.0 02/27/2016 1639   MCH 30.2 04/26/2020 0012   MCHC 34.2 04/26/2020 0012   RDW 11.7 04/26/2020 0012   LYMPHSABS 3.6 04/26/2020 0012   MONOABS 0.9 04/26/2020 0012   EOSABS 0.1 04/26/2020 0012   BASOSABS 0.0 04/26/2020 0012     CMP     Component Value Date/Time   NA 143 04/26/2020 0019   K 3.9 04/26/2020 0019   CL 104 04/26/2020 0019   CO2 25 04/26/2020 0012   GLUCOSE 100 (H) 04/26/2020 0019   BUN 16 04/26/2020 0019   CREATININE 0.70 04/26/2020 0019   CREATININE 0.72 02/27/2016 1644   CALCIUM 8.9 04/26/2020 0012   PROT 8.0 04/26/2020 0012   ALBUMIN 4.1 04/26/2020 0012   AST 19 04/26/2020 0012   ALT 19 04/26/2020 0012   ALKPHOS 102 04/26/2020 0012   BILITOT 0.8 04/26/2020 0012   GFRNONAA >60 04/26/2020 0012    Imaging:  CT Head w/o C IMPRESSION: Normal head CT  MRI Head w/o C IMPRESSION: 1. Small acute lacunar infarct in the posterior right corona radiata. No associated hemorrhage or mass effect.  2. No other acute intracranial  abnormality. Underlying mild to moderate for age subcortical white matter signal changes, nonspecific but most commonly due to chronic small vessel disease.  EKG: personally reviewed my interpretation is rate of 73 normal sinus rhythm. Normal Axis. Normal PR and QT intervals. No ST elevations appreciated. No prior EKG for comparison.  Assessment & Plan by Problem:  Active Problems:   Stroke (HCC)   Tynesha Free is a 65 y.o. 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 Evidence of small lacunar infarct on MRI. Uncertain etiology at this time, patient states her hypertension is well controlled on amlodipine and losartan, average pressures of 115-120's/70-80's at home. No other risk factors for CVA noted. Stroke workup in place and stroke team following, appreciate their input.  -stroke work up of:   Bilateral carotid US  Echocardiogram  PT/OT/Speech consult, NPO until speech evaluation  HgbA1c  Lipid Panel -start 81 mg aspirin daily, no evidence of hemorrhage on imaging -holding anti-hypertensives allowing for permissive hypertension -modified Rankin Score of 0.   #Hx of Hypertension Hold home meds of losartan and amlodipine in context of acute CVA  Diet: NPO VTE: SCDs IVF: None,None Code: Full  Prior to Admission Living Arrangement: Home, living w/ family Anticipated Discharge Location: Uncertain at this time Barriers to Discharge: Further stroke work-up, PT/OT/SLP recommendations  Dispo: Admit patient to Inpatient with expected length of stay greater than 2 midnights.  Signed: Belva Agee, MD Internal Medicine Resident PGY-1 Pager: [phone]  04/26/2020, 6:49 PM

## 2020-04-26 NOTE — Progress Notes (Signed)
  Echocardiogram 2D Echocardiogram has been performed.  Molly Richards 04/26/2020, 6:14 PM

## 2020-04-26 NOTE — ED Provider Notes (Signed)
[facility] EMERGENCY DEPARTMENT Provider Note   CSN: [id number] Arrival date & time: 04/25/20  2344     History Chief Complaint  Patient presents with  . Weakness    Molly Richards is a 65 y.o. female.  Patient with previous history of high blood pressure presents the emergency department for evaluation of left arm and leg weakness.  Symptoms started about 3 PM yesterday when the patient was carrying dishes.  She noted that she was having difficulty gripping things and with walking.  Denies numbness but states uncomfortable sensation in the left lower extremity.  History of some lower back pain, not currently present unless the patient is stretching.  She denies neck pain.  No headache or vomiting.  No confusion.  She feels a weak sensation in her left arm.  Patient denies other signs of stroke including: facial droop, slurred speech, aphasia, weakness/numbness in extremities, imbalance/trouble walking.  She felt dizzy when symptoms started but this has resolved. No recent illnesses reported.          No past medical history on file.  There are no problems to display for this patient.   Past Surgical History:  Procedure Laterality Date  . CESAREAN SECTION       OB History   No obstetric history on file.     Family History  Problem Relation Age of Onset  . Hypertension Mother     Social History   Tobacco Use  . Smoking status: Never Smoker  . Smokeless tobacco: Never Used  Substance Use Topics  . Alcohol use: No  . Drug use: No    Home Medications Prior to Admission medications   Medication Sig Start Date End Date Taking? Authorizing Provider  amoxicillin-clavulanate (AUGMENTIN) 875-125 MG tablet Take 1 tablet by mouth 2 (two) times daily. 02/27/16   [name], MD  losartan (COZAAR) 100 MG tablet Take 0.5 tablets (50 mg total) by mouth daily. 02/27/16   [name], MD  nystatin (MYCOSTATIN) 100000 UNIT/ML suspension Use as directed 5 mLs (500,000  Units total) in the mouth or throat 4 (four) times daily. Use as a gargle in mouth 4 times daily 02/29/16   Forrest Moron, MD    Allergies    Sulfa antibiotics  Review of Systems   Review of Systems  Constitutional: Negative for fever.  HENT: Negative for rhinorrhea and sore throat.   Eyes: Negative for redness.  Respiratory: Negative for cough.   Cardiovascular: Negative for chest pain.  Gastrointestinal: Negative for abdominal pain, diarrhea, nausea and vomiting.  Genitourinary: Negative for dysuria, frequency, hematuria and urgency.  Musculoskeletal: Negative for myalgias.  Skin: Negative for rash.  Neurological: Positive for dizziness (resolved) and weakness. Negative for numbness and headaches.    Physical Exam Updated Vital Signs BP 134/62 (BP Location: Left Arm)   Pulse (!) 56   Temp (!) 97.4 F (36.3 C) (Oral)   Resp 16   SpO2 99%   Physical Exam Vitals and nursing note reviewed.  Constitutional:      General: She is not in acute distress.    Appearance: She is well-developed.  HENT:     Head: Normocephalic and atraumatic.     Right Ear: External ear normal.     Left Ear: External ear normal.     Nose: Nose normal.  Eyes:     Conjunctiva/sclera: Conjunctivae normal.  Neck:     Vascular: No carotid bruit.     Comments: No carotid  bruit auscultated. Cardiovascular:     Rate and Rhythm: Normal rate and regular rhythm.     Heart sounds: Murmur heard.      Comments: Mild 2 out of 6 systolic murmur heard at the left upper sternal border.  Rhythm is regular. Pulmonary:     Effort: No respiratory distress.     Breath sounds: No wheezing, rhonchi or rales.  Abdominal:     Palpations: Abdomen is soft.     Tenderness: There is no abdominal tenderness. There is no guarding or rebound.  Musculoskeletal:     Cervical back: Normal range of motion and neck supple.     Right lower leg: No edema.     Left lower leg: No edema.  Skin:    General: Skin is warm and  dry.     Findings: No rash.  Neurological:     Mental Status: She is alert. Mental status is at baseline.     Motor: No weakness.     Comments: Patient is able to hold up left arm and leg against gravity.  Some giveaway weakness with grip and plantar/dorsiflexion of the foot. Coordination in normal.     Psychiatric:        Mood and Affect: Mood normal.     ED Results / Procedures / Treatments   Labs (all labs ordered are listed, but only abnormal results are displayed) Labs Reviewed  COMPREHENSIVE METABOLIC PANEL - Abnormal; Notable for the following components:      Result Value   Glucose, Bld 103 (*)    All other components within normal limits  I-STAT CHEM 8, ED - Abnormal; Notable for the following components:   Glucose, Bld 100 (*)    Calcium, Ion 1.12 (*)    All other components within normal limits  PROTIME-INR  APTT  CBC  DIFFERENTIAL  CBG MONITORING, ED    ED ECG REPORT   Date: 04/26/2020  Rate: 72  Rhythm: normal sinus rhythm  QRS Axis: normal  Intervals: normal  ST/T Wave abnormalities: normal  Conduction Disutrbances:none  Narrative Interpretation: poor baseline in anterior leads  Old EKG Reviewed: none available  I have personally reviewed the EKG tracing and disagree with the computerized printout as noted.  Radiology CT HEAD WO CONTRAST  Result Date: 04/26/2020 CLINICAL DATA:  Left-sided weakness EXAM: CT HEAD WITHOUT CONTRAST TECHNIQUE: Contiguous axial images were obtained from the base of the skull through the vertex without intravenous contrast. COMPARISON:  None. FINDINGS: Brain: There is no mass, hemorrhage or extra-axial collection. The size and configuration of the ventricles and extra-axial CSF spaces are normal. The brain parenchyma is normal, without acute or chronic infarction. Vascular: No abnormal hyperdensity of the major intracranial arteries or dural venous sinuses. No intracranial atherosclerosis. Skull: The visualized skull base,  calvarium and extracranial soft tissues are normal. Sinuses/Orbits: No fluid levels or advanced mucosal thickening of the visualized paranasal sinuses. No mastoid or middle ear effusion. The orbits are normal. IMPRESSION: Normal head CT. Electronically Signed   By: Deatra Robinson M.D.   On: 04/26/2020 01:07   MR BRAIN WO CONTRAST  Result Date: 04/26/2020 CLINICAL DATA:  66 year old female with left side weakness onset yesterday. EXAM: MRI HEAD WITHOUT CONTRAST TECHNIQUE: Multiplanar, multiecho pulse sequences of the brain and surrounding structures were obtained without intravenous contrast. COMPARISON:  Head CT 0048 hours today. FINDINGS: Brain: Small linear 8 mm area of restricted diffusion in the posterior right corona radiata tracking toward the lentiform (series 5, image  77). Faint FLAIR and T2 hyperintensity. No hemorrhage or mass effect. No other restricted diffusion. No midline shift, mass effect, evidence of mass lesion, ventriculomegaly, extra-axial collection or acute intracranial hemorrhage. Cervicomedullary junction and pituitary are within normal limits. Widely scattered subcortical white matter T2 and FLAIR hyperintensity, mild to moderate for age. No cortical encephalomalacia or chronic cerebral blood products identified. Deep gray nuclei, brainstem and cerebellum remain within normal limits. Vascular: Major intracranial vascular flow voids are preserved. Skull and upper cervical spine: Congenital incomplete segmentation of C2-C3. Otherwise negative for age. Sinuses/Orbits: Negative. Other: Mastoids are clear. Grossly normal visible internal auditory structures. Negative visible scalp and face soft tissues. IMPRESSION: 1. Small acute lacunar infarct in the posterior right corona radiata. No associated hemorrhage or mass effect. 2. No other acute intracranial abnormality. Underlying mild to moderate for age subcortical white matter signal changes, nonspecific but most commonly due to chronic small  vessel disease. Electronically Signed   By: Genevie Ann M.D.   On: 04/26/2020 09:49    Procedures Procedures (including critical care time)  Medications Ordered in ED Medications  sodium chloride flush (NS) 0.9 % injection 3 mL (has no administration in time range)    ED Course  I have reviewed the triage vital signs and the nursing notes.  Pertinent labs & imaging results that were available during my care of the patient were reviewed by me and considered in my medical decision making (see chart for details).  Patient seen and examined. MRI ordered to evaluate for CVA given unilateral symptoms.   Vital signs reviewed and are as follows: BP 134/62 (BP Location: Left Arm)   Pulse (!) 56   Temp (!) 97.4 F (36.3 C) (Oral)   Resp 16   SpO2 99%   Patient with 8 mm lacunar infarct, right-sided, corona radiata.   Discussed findings with neuro hospitalist, Dr. Cheral Marker, who will see in consultation.  Discussed with internal medicine teaching service who admit patient to hospital for stroke work-up.  Patient and son at bedside updated on results.  They are in agreement with plan.    MDM Rules/Calculators/A&P                           Admit.  Final Clinical Impression(s) / ED Diagnoses Final diagnoses:  Lacunar infarction [facility])  Acute left-sided weakness    Rx / DC Orders ED Discharge Orders    None       Carlisle Cater, Hershal Coria 04/26/20 1512    Quintella Reichert, MD 04/26/20 [phone]

## 2020-04-26 NOTE — Consult Note (Addendum)
Referring Physician: Dr. Ralene Bathe    Chief Complaint: Acute onset of left sided weakness.   HPI: Molly Richards is a 65 y.o. female with a PMHx of HTN and total hysterectomy (10/2018) who presents to the ED after acute onset of LUE and LLE weakness yesterday. LKN 1500. In addition to the weakness, she felt a little dizzy initially, which then subsided. She was also finding it hard to walk in a straight line.   NOTE: English is limited patient speaks [place]. Son interpreted for exam. Per patient she was visiting with family . She went to stand up with dishes and had sudden onset of left sided weakness. She stumbled but did not fall and did not drop the dishes. No facial droop or slurred speech noted. She layed down, rested and drank water. Then her son was concerned that it might not be fatigue and called a nurse who recommended that he bring her to the ED. Denies any HA, numbness, tingling, falls, CP, SOB. Denies any ETOH use, smoking or drug use. Does not take a daily ASA. Does take amlodipine 5 mg daily for HTN denies missing any doses.   NIHSS:0 ED course: CTH: normal head CT MRI brain: Lacunar infarct posterior right corona radiata  Carotid doppler: no stenosis in right or left ICA ECHO: pending  LSN: 1500, 04/25/2020 tPA Given: No: Out of the time window.   Past Medical History:  Diagnosis Date  . HTN (hypertension)     Past Surgical History:  Procedure Laterality Date  . CESAREAN SECTION    . TOTAL ABDOMINAL HYSTERECTOMY  10/2018    Family History  Problem Relation Age of Onset  . Hypertension Mother    Social History:  reports that she has never smoked. She has never used smokeless tobacco. She reports that she does not drink alcohol and does not use drugs.  Allergies:  Allergies  Allergen Reactions  . Sulfa Antibiotics Rash    Medications:  Current Facility-Administered Medications  Medication Dose Route Frequency Provider Last Rate Last Admin  . sodium chloride flush (NS) 0.9  % injection 3 mL  3 mL Intravenous Once Quintella Reichert, MD       Current Outpatient Medications  Medication Sig Dispense Refill  . amLODipine (NORVASC) 5 MG tablet Take 5 mg by mouth daily.    Marland Kitchen ascorbic acid (VITAMIN C) 100 MG tablet Take 100 mg by mouth daily.    . Cholecalciferol 25 MCG (1000 UT) tablet Take 1,000 Units by mouth daily.    Marland Kitchen losartan (COZAAR) 100 MG tablet Take 0.5 tablets (50 mg total) by mouth daily. (Patient not taking: No sig reported) 90 tablet 3    ROS: ROS was performed and is negative except as noted in HPI   Physical Examination: Blood pressure (!) 164/75, pulse 67, temperature (!) 97.4 F (36.3 C), temperature source Oral, resp. rate 14, SpO2 100 %.  Neurologic Examination: Consitutional: appears well nourished and developed HEENT: normocephalic CV: RRR Lungs: no SOB on RA sats > 92% Musculoskeletal: no tenderness, deformity or swelling Skin: w/d/i  Neuro: Ment: Alert, oriented to name/age/month/year. No aphasia noted. No dysarthria noted. Able to follow commands. Naming intact CN: VFF, PERRL, EOMI no ptosis, smile symmetric. Facial LT sensation intact. Hearing intact. Tongue protrudes midline. Motor: RUE and RLE 5/5. LUE 4+/5. LLE 4-/5 Sensory: intact to LT DTRs: 2+  and symmetric biceps and patellae. Toes: down going bilaterally Cerebellar: FNF , RAM intact, HTS intact Gait: Normal gait  Results for orders  placed or performed during the hospital encounter of 04/25/20 (from the past 48 hour(s))  Protime-INR     Status: None   Collection Time: 04/26/20 12:12 AM  Result Value Ref Range   Prothrombin Time 12.4 11.4 - 15.2 seconds   INR 1.0 0.8 - 1.2    Comment: (NOTE) INR goal varies based on device and disease states. Performed at [facility], [place] [address] [place], [place] [address]   APTT     Status: None   Collection Time: 04/26/20 12:12 AM  Result Value Ref Range   aPTT 29 24 - 36 seconds    Comment: Performed at [facility] [address] [place], [place] [address]  CBC     Status: None   Collection Time: 04/26/20 12:12 AM  Result Value Ref Range   WBC 8.3 4.0 - 10.5 K/uL   RBC 4.10 3.87 - 5.11 MIL/uL   Hemoglobin 12.4 12.0 - 15.0 g/dL   HCT 36.3 36.0 - 46.0 %   MCV 88.5 80.0 - 100.0 fL   MCH 30.2 26.0 - 34.0 pg   MCHC 34.2 30.0 - 36.0 g/dL   RDW 11.7 11.5 - 15.5 %   Platelets 295 150 - 400 K/uL   nRBC 0.0 0.0 - 0.2 %    Comment: Performed at [facility], [place] [address] [place], [place] [address]  Differential     Status: None   Collection Time: 04/26/20 12:12 AM  Result Value Ref Range   Neutrophils Relative % 43 %   Neutro Abs 3.7 1.7 - 7.7 K/uL   Lymphocytes Relative 43 %   Lymphs Abs 3.6 0.7 - 4.0 K/uL   Monocytes Relative 11 %   Monocytes Absolute 0.9 0.1 - 1.0 K/uL   Eosinophils Relative 2 %   Eosinophils Absolute 0.1 0.0 - 0.5 K/uL   Basophils Relative 1 %   Basophils Absolute 0.0 0.0 - 0.1 K/uL   Immature Granulocytes 0 %   Abs Immature Granulocytes 0.02 0.00 - 0.07 K/uL    Comment: Performed at [facility] [address] [place], [place] [address]  Comprehensive metabolic panel     Status: Abnormal   Collection Time: 04/26/20 12:12 AM  Result Value Ref Range   Sodium 139 135 - 145 mmol/L   Potassium 3.8 3.5 - 5.1 mmol/L   Chloride 106 98 - 111 mmol/L   CO2 25 22 - 32 mmol/L   Glucose, Bld 103 (H) 70 - 99 mg/dL    Comment: Glucose reference range applies only to samples taken after fasting for at least 8 hours.   BUN 11 8 - 23 mg/dL   Creatinine, Ser 0.72 0.44 - 1.00 mg/dL   Calcium 8.9 8.9 - 10.3 mg/dL   Total Protein 8.0 6.5 - 8.1 g/dL   Albumin 4.1 3.5 - 5.0 g/dL   AST 19 15 - 41 U/L   ALT 19 0 - 44 U/L   Alkaline Phosphatase 102 38 - 126 U/L   Total Bilirubin 0.8 0.3 - 1.2 mg/dL   GFR, Estimated >60 >60 mL/min    Comment: (NOTE) Calculated using the CKD-EPI Creatinine Equation (2021)    Anion gap 8 5 - 15    Comment: Performed at [facility] [address], [place], [place] [address]  I-stat chem 8, ED     Status: Abnormal   Collection Time: 04/26/20 12:19 AM  Result Value Ref Range  Sodium 143 135 - 145 mmol/L   Potassium 3.9 3.5 - 5.1 mmol/L   Chloride 104 98 - 111 mmol/L   BUN 16 8 - 23 mg/dL   Creatinine, Ser 2.44 0.44 - 1.00 mg/dL   Glucose, Bld 010 (H) 70 - 99 mg/dL    Comment: Glucose reference range applies only to samples taken after fasting for at least 8 hours.   Calcium, Ion 1.12 (L) 1.15 - 1.40 mmol/L   TCO2 25 22 - 32 mmol/L   Hemoglobin 12.9 12.0 - 15.0 g/dL   HCT 27.2 53.6 - 64.4 %   CT HEAD WO CONTRAST  Result Date: 04/26/2020 CLINICAL DATA:  Left-sided weakness EXAM: CT HEAD WITHOUT CONTRAST TECHNIQUE: Contiguous axial images were obtained from the base of the skull through the vertex without intravenous contrast. COMPARISON:  None. FINDINGS: Brain: There is no mass, hemorrhage or extra-axial collection. The size and configuration of the ventricles and extra-axial CSF spaces are normal. The brain parenchyma is normal, without acute or chronic infarction. Vascular: No abnormal hyperdensity of the major intracranial arteries or dural venous sinuses. No intracranial atherosclerosis. Skull: The visualized skull base, calvarium and extracranial soft tissues are normal. Sinuses/Orbits: No fluid levels or advanced mucosal thickening of the visualized paranasal sinuses. No mastoid or middle ear effusion. The orbits are normal. IMPRESSION: Normal head CT. Electronically Signed   By: Deatra Robinson M.D.   On: 04/26/2020 01:07   MR BRAIN WO CONTRAST  Result Date: 04/26/2020 CLINICAL DATA:  65 year old female with left side weakness onset yesterday. EXAM: MRI HEAD WITHOUT CONTRAST TECHNIQUE: Multiplanar, multiecho pulse sequences of the brain and surrounding structures were obtained without intravenous contrast. COMPARISON:  Head CT 0048 hours today. FINDINGS: Brain: Small linear 8 mm area of restricted diffusion in  the posterior right corona radiata tracking toward the lentiform (series 5, image 77). Faint FLAIR and T2 hyperintensity. No hemorrhage or mass effect. No other restricted diffusion. No midline shift, mass effect, evidence of mass lesion, ventriculomegaly, extra-axial collection or acute intracranial hemorrhage. Cervicomedullary junction and pituitary are within normal limits. Widely scattered subcortical white matter T2 and FLAIR hyperintensity, mild to moderate for age. No cortical encephalomalacia or chronic cerebral blood products identified. Deep gray nuclei, brainstem and cerebellum remain within normal limits. Vascular: Major intracranial vascular flow voids are preserved. Skull and upper cervical spine: Congenital incomplete segmentation of C2-C3. Otherwise negative for age. Sinuses/Orbits: Negative. Other: Mastoids are clear. Grossly normal visible internal auditory structures. Negative visible scalp and face soft tissues. IMPRESSION: 1. Small acute lacunar infarct in the posterior right corona radiata. No associated hemorrhage or mass effect. 2. No other acute intracranial abnormality. Underlying mild to moderate for age subcortical white matter signal changes, nonspecific but most commonly due to chronic small vessel disease. Electronically Signed   By: Odessa Fleming M.D.   On: 04/26/2020 09:49    Assessment: 66 y.o. female presenting with a small acute right corona radiata ischemic infarction 1. Exam reveals mild left side weakness. Patient c/o some discomfort in left leg and that it still feels weak, but notes some improvement.  2. MRI brain: Small acute lacunar infarct in the posterior right corona radiata. No associated hemorrhage or mass effect. Underlying mild to moderate for age subcortical white matter signal changes, nonspecific but most commonly due to chronic small vessel disease. 3. Carotid doppler: no stenosis in right or left ICA 4. Stroke Risk Factors - hypertension  Recommendations: 1.  HgbA1c, fasting lipid panel 2. MRA of the  brain without contrast 3. PT consult, OT consult, Speech consult 4. Echocardiogram 5. Prophylactic therapy-Antiplatelet med: Aspirin - dose 81 mg 7. Risk factor modification 8. Telemetry monitoring 9. Frequent neuro checks 10: Statin 11. BP management. Out of the permissive HTN time window.    Laurey Morale, MSN, NP-C [facility] [phone]  @Electronically  signed: Dr. Kerney Elbe  04/26/2020, 11:30 AM

## 2020-04-26 NOTE — ED Triage Notes (Signed)
Report called to rn on 5 c 

## 2020-04-26 NOTE — Progress Notes (Signed)
Carotid US completed    Please see CV Proc for preliminary results.   Lisa Gibson, RVT  

## 2020-04-26 NOTE — ED Notes (Signed)
Pt able to ambulate to restroom, states left side feels very weak and it is hard to walk in a straight line. Denies feeling lightheaded or dizzy

## 2020-04-27 ENCOUNTER — Other Ambulatory Visit: Payer: Self-pay

## 2020-04-27 ENCOUNTER — Encounter (HOSPITAL_COMMUNITY): Payer: Self-pay | Admitting: Internal Medicine

## 2020-04-27 ENCOUNTER — Inpatient Hospital Stay (HOSPITAL_COMMUNITY): Payer: BLUE CROSS/BLUE SHIELD

## 2020-04-27 DIAGNOSIS — I6300 Cerebral infarction due to thrombosis of unspecified precerebral artery: Secondary | ICD-10-CM

## 2020-04-27 DIAGNOSIS — Z8679 Personal history of other diseases of the circulatory system: Secondary | ICD-10-CM | POA: Diagnosis not present

## 2020-04-27 DIAGNOSIS — E785 Hyperlipidemia, unspecified: Secondary | ICD-10-CM | POA: Diagnosis not present

## 2020-04-27 DIAGNOSIS — I6381 Other cerebral infarction due to occlusion or stenosis of small artery: Secondary | ICD-10-CM | POA: Diagnosis not present

## 2020-04-27 DIAGNOSIS — E876 Hypokalemia: Secondary | ICD-10-CM

## 2020-04-27 LAB — COMPREHENSIVE METABOLIC PANEL
ALT: 18 U/L (ref 0–44)
AST: 17 U/L (ref 15–41)
Albumin: 3.8 g/dL (ref 3.5–5.0)
Alkaline Phosphatase: 97 U/L (ref 38–126)
Anion gap: 7 (ref 5–15)
BUN: 16 mg/dL (ref 8–23)
CO2: 24 mmol/L (ref 22–32)
Calcium: 9.2 mg/dL (ref 8.9–10.3)
Chloride: 106 mmol/L (ref 98–111)
Creatinine, Ser: 0.78 mg/dL (ref 0.44–1.00)
GFR, Estimated: >60 mL/min (ref >60)
Glucose, Bld: 92 mg/dL (ref 70–99)
Potassium: 3.4 mmol/L — ABNORMAL LOW (ref 3.5–5.1)
Sodium: 137 mmol/L (ref 135–145)
Total Bilirubin: 1.1 mg/dL (ref 0.3–1.2)
Total Protein: 7.7 g/dL (ref 6.5–8.1)

## 2020-04-27 MED ORDER — ASPIRIN 81 MG PO CHEW: 81.0000 mg | CHEWABLE_TABLET | Freq: Every day | ORAL | Status: AC

## 2020-04-27 MED ORDER — ROSUVASTATIN CALCIUM 20 MG PO TABS
20.0000 mg | ORAL_TABLET | Freq: Every day | ORAL | Status: DC
  Administered 2020-04-27: 20 mg via ORAL
  Filled 2020-04-27: qty 1

## 2020-04-27 MED ORDER — CLOPIDOGREL BISULFATE 75 MG PO TABS: 75.0000 mg | ORAL_TABLET | Freq: Every day | ORAL | Status: DC

## 2020-04-27 MED ORDER — CLOPIDOGREL BISULFATE 300 MG PO TABS
300.0000 mg | ORAL_TABLET | Freq: Once | ORAL | Status: AC
  Administered 2020-04-27: 300 mg via ORAL
  Filled 2020-04-27: qty 1

## 2020-04-27 MED ORDER — CLOPIDOGREL BISULFATE 75 MG PO TABS: 75.0000 mg | ORAL_TABLET | Freq: Every day | ORAL | 0 refills | Status: AC

## 2020-04-27 MED ORDER — ROSUVASTATIN CALCIUM 40 MG PO TABS: 40.0000 mg | ORAL_TABLET | Freq: Every day | ORAL | 3 refills | Status: AC

## 2020-04-27 MED ORDER — ROSUVASTATIN CALCIUM 20 MG PO TABS: 40.0000 mg | ORAL_TABLET | Freq: Every day | ORAL | Status: DC

## 2020-04-27 MED ORDER — POTASSIUM CHLORIDE CRYS ER 20 MEQ PO TBCR
20.0000 meq | EXTENDED_RELEASE_TABLET | Freq: Two times a day (BID) | ORAL | Status: DC
  Administered 2020-04-27: 20 meq via ORAL
  Filled 2020-04-27: qty 1

## 2020-04-27 NOTE — Evaluation (Signed)
Occupational Therapy Evaluation Patient Details Name: Molly Richards MRN: [id number] DOB: 09-22-1955 Today's Date: 04/27/2020    History of Present Illness 64yo female who presented with dysarthria, L sided weakness, and gait abnormality. Found to have small acute lacunar infarct in posterior R corona radiata. PMH HTN   Clinical Impression   Pt PTA: Pt living with family, independently; owns a dry cleaning business. Pt currently using LUE for tasks and requires slightly increased time for tasks, but otherwise, only minimal weakness for functional tasks. Pt R hand dominant and performing all tasks with LUE and no physical assist needed. Pt set-up for shower with son to supervise, NT aware. OT signing off as pt aware of need to use LUE as much as RUE to attain PLOF. Pt reported no sensation changes. Pt does not require continued OT skilled services. OT signing off, thank you.    Follow Up Recommendations  No OT follow up;Supervision - Intermittent    Equipment Recommendations  None recommended by OT    Recommendations for Other Services       Precautions / Restrictions Precautions Precautions: Other (comment) Precaution Comments: L UE>LE weakness Restrictions Weight Bearing Restrictions: No      Mobility Bed Mobility Overal bed mobility: Independent                  Transfers Overall transfer level: Independent Equipment used: None Transfers: Sit to/from Stand Sit to Stand: Independent         General transfer comment: no physical assist required    Balance                                           ADL either performed or assessed with clinical judgement   ADL Overall ADL's : Modified independent;At baseline                                       General ADL Comments: Pt using LUE for tasks and requires slightly increased time for tasks, but otherwise, only minimal weakness for functional tasks.     Vision Baseline  Vision/History: Wears glasses Wears Glasses: Reading only Patient Visual Report: No change from baseline Vision Assessment?: No apparent visual deficits     Perception     Praxis      Pertinent Vitals/Pain Pain Assessment: Faces Faces Pain Scale: Hurts a little bit Pain Location: L shoulder Pain Descriptors / Indicators: Discomfort Pain Intervention(s): Monitored during session;Repositioned     Hand Dominance     Extremity/Trunk Assessment Upper Extremity Assessment Upper Extremity Assessment: LUE deficits/detail LUE Deficits / Details: 4-/5 MM grade compared to 4/5 MM grade in RUE LUE Coordination: decreased fine motor   Lower Extremity Assessment Lower Extremity Assessment: Defer to PT evaluation   Cervical / Trunk Assessment Cervical / Trunk Assessment: Normal   Communication Communication Communication: No difficulties   Cognition Arousal/Alertness: Awake/alert Behavior During Therapy: WFL for tasks assessed/performed Overall Cognitive Status: Within Functional Limits for tasks assessed                                 General Comments: very pleasant and cooperative, son present and interpreted [place] language   General Comments  Son present to interpret in [place]  Exercises     Shoulder Instructions      Home Living Family/patient expects to be discharged to:: Private residence Living Arrangements: Spouse/significant other;Children Available Help at Discharge: Family;Available 24 hours/day Type of Home: House Home Access: Stairs to enter [employer] of Steps: 4 with L ascending rail Entrance Stairs-Rails: Left Home Layout: One level     Bathroom Shower/Tub: Chief Strategy Officer: Standard     Home Equipment: None   Additional Comments: very active gardener at baseline, exercising at the gym doing strength training every day for 40-60 minutes      Prior Functioning/Environment Level of Independence:  Independent                 OT Problem List: Decreased activity tolerance      OT Treatment/Interventions:      OT Goals(Current goals can be found in the care plan section) Acute Rehab OT Goals Patient Stated Goal: to go home and get back to work OT Goal Formulation: With patient  OT Frequency:     Barriers to D/C:            Co-evaluation              AM-PAC OT "6 Clicks" Daily Activity     Outcome Measure Help from another person eating meals?: None Help from another person taking care of personal grooming?: None Help from another person toileting, which includes using toliet, bedpan, or urinal?: None Help from another person bathing (including washing, rinsing, drying)?: None Help from another person to put on and taking off regular upper body clothing?: None Help from another person to put on and taking off regular lower body clothing?: None 6 Click Score: 24   End of Session Nurse Communication: Mobility status  Activity Tolerance: Patient tolerated treatment well Patient left: Other (comment) (up with son in shower; NT aware)  OT Visit Diagnosis: Muscle weakness (generalized) (M62.81)                Time: 3546-5681 OT Time Calculation (min): 16 min Charges:  OT General Charges $OT Visit: 1 Visit OT Evaluation $OT Eval Moderate Complexity: 1 Mod  Flora Lipps, OTR/L Acute Rehabilitation Services Pager: [phone] Office: [phone]   Raul Torrance C 04/27/2020, 5:38 PM

## 2020-04-27 NOTE — TOC Initial Note (Signed)
Transition of Care Molly Richards) - Initial/Assessment Note    Patient Details  Name: Molly Richards MRN: [id number] Date of Birth: 1956/03/08  Transition of Care [facility]) CM/SW Contact:    Kingsley Plan, RN Phone Number: 04/27/2020, 10:23 AM  Clinical Narrative:                 Spoke to patient and [name] at bedside. Discussed Neuro OP PT on [address]. Molly Richards can provided transportation. Order placed. NCM confirmed number with Molly Richards. OP PT will call for appointment.    Expected Discharge Plan: Home/Self Care     Patient Goals and CMS Choice Patient states their goals for this hospitalization and ongoing recovery are:: to return to home CMS Medicare.gov Compare Post Acute Care list provided to:: Patient Choice offered to / list presented to : Patient,Adult Children (son Molly Richards)  Expected Discharge Plan and Services Expected Discharge Plan: Home/Self Care   Discharge Planning Services: CM Consult   Living arrangements for the past 2 months: Single Family Home                 DME Arranged: N/A         HH Arranged: NA          Prior Living Arrangements/Services Living arrangements for the past 2 months: Single Family Home Lives with:: Relatives Patient language and need for interpreter reviewed:: Yes Do you feel safe going back to the place where you live?: Yes      Need for Family Participation in Patient Care: Yes (Comment) Care giver support system in place?: Yes (comment)   Criminal Activity/Legal Involvement Pertinent to Current Situation/Hospitalization: No - Comment as needed  Activities of Daily Living Home Assistive Devices/Equipment: None ADL Screening (condition at time of admission) Patient's cognitive ability adequate to safely complete daily activities?: Yes Is the patient deaf or have difficulty hearing?: No Does the patient have difficulty seeing, even when wearing glasses/contacts?: No Does the patient have difficulty concentrating, remembering, or making  decisions?: No Patient able to express need for assistance with ADLs?: Yes Does the patient have difficulty dressing or bathing?: No Independently performs ADLs?: Yes (appropriate for developmental age) Does the patient have difficulty walking or climbing stairs?: No Weakness of Legs: Left Weakness of Arms/Hands: Left  Permission Sought/Granted   Permission granted to share information with : No              Emotional Assessment Appearance:: Appears stated age Attitude/Demeanor/Rapport: Engaged Affect (typically observed): Accepting Orientation: : Oriented to Self,Oriented to Place,Oriented to  Time,Oriented to Situation Alcohol / Substance Use: Not Applicable Psych Involvement: No (comment)  Admission diagnosis:  Lacunar infarction (HCC) [I63.81] Stroke [facility]) [I63.9] Acute left-sided weakness [R53.1] Patient Active Problem List   Diagnosis Date Noted  . Stroke (HCC) 04/26/2020   PCP:  Sherren Mocha, MD Pharmacy:   [facility] [phone] - [place], [place] - [address] AT [facility] [address] [place] [address] Phone: [phone] Fax: [phone]  [id number] [place], [place] - [address] AT [facility] [address] [facility] [address] [place] [address] Phone: [phone] Fax: [phone]     Social Determinants of Health (SDOH) Interventions    Readmission Risk Interventions No flowsheet data found.

## 2020-04-27 NOTE — Evaluation (Signed)
Clinical/Bedside Swallow Evaluation Patient Details  Name: Molly Richards MRN: [id number] Date of Birth: [birth year]  Today's Date: 04/27/2020 Time: SLP Start Time (ACUTE ONLY): 1210 SLP Stop Time (ACUTE ONLY): 1230 SLP Time Calculation (min) (ACUTE ONLY): 20 min  Past Medical History:  Past Medical History:  Diagnosis Date  . HTN (hypertension)    Past Surgical History:  Past Surgical History:  Procedure Laterality Date  . CESAREAN SECTION    . TOTAL ABDOMINAL HYSTERECTOMY  10/2018   HPI:  64yo female admitted 04/25/20 with dysarthria, left weakness, gait abnormalities. PMH: HTN. MRI = small acute lacunar infarct in posterior right corona radiata   Assessment / Plan / Recommendation Clinical Impression  Pt seen at bedside for clinical swallow evaluation. Son was present to assist with language barrier. Pt was able to answer some questions herself. CN exam is unremarkable. Dentition is adequate. No obvious oral issues, and no overt s/s aspiration observed on any texture. Son does not report difficulty swallowing. Pt speech and language appear to be at baseline per son's information. Pt's son reports pt has had recent difficulty with correct recall of names, even prior to CVA. Pt and son were encouraged to notify PCP if this worsens. No further ST intervention recommended at this time. Please reconsult if needs arise.    SLP Visit Diagnosis: Dysphagia, unspecified (R13.10)    Aspiration Risk  Mild aspiration risk    Diet Recommendation Regular;Thin liquid   Liquid Administration via: Cup;Straw Medication Administration: Whole meds with liquid Supervision: Patient able to self feed Compensations: Minimize environmental distractions;Slow rate;Small sips/bites Postural Changes: Seated upright at 90 degrees    Other  Recommendations Oral Care Recommendations: Oral care BID   Follow up Recommendations None          Prognosis Prognosis for Safe Diet Advancement: Good      Swallow  Study   General Date of Onset: 04/25/20 HPI: 64yo female admitted 04/25/20 with dysarthria, left weakness, gait abnormalities. PMH: HTN. MRI = small acute lacunar infarct in posterior right corona radiata Type of Study: Bedside Swallow Evaluation Previous Swallow Assessment: none Diet Prior to this Study: Regular;Thin liquids Temperature Spikes Noted: No Respiratory Status: Room air History of Recent Intubation: No Behavior/Cognition: Alert;Cooperative;Pleasant mood Oral Cavity Assessment: Within Functional Limits Oral Care Completed by SLP: No Oral Cavity - Dentition: Adequate natural dentition Vision: Functional for self-feeding Self-Feeding Abilities: Able to feed self Patient Positioning: Upright in bed Baseline Vocal Quality: Normal Volitional Cough: Strong Volitional Swallow: Able to elicit    Oral/Motor/Sensory Function Overall Oral Motor/Sensory Function: Within functional limits   Ice Chips     Thin Liquid Thin Liquid: Within functional limits Presentation: Straw;Self Fed    Puree Puree: Within functional limits Presentation: Self Fed   Solid     Solid: Within functional limits Presentation: Self Fed      Maliq Pilley B. Murvin Natal, [facility], CCC-SLP Speech Language Pathologist Office: [phone] Pager: [phone]  Leigh Aurora 04/27/2020,12:32 PM

## 2020-04-27 NOTE — Evaluation (Signed)
Physical Therapy Evaluation Patient Details Name: Molly Richards MRN: [id number] DOB: 01/24/1956 Today's Date: 04/27/2020   History of Present Illness  65yo female who presented with dysarthria, L sided weakness, and gait abnormality. Found to have small acute lacunar infarct in posterior R corona radiata. PMH HTN  Clinical Impression   Patient received in bed, son present and acted as interpreter for Korean today. She is very pleasant, cooperative, and motivated with therapy. Able to mobilize on an independent to min guard basis safely with no device. Does continue to demonstrate significant weakness/impaired proprioception in L UE, milder but still present in L LE. Education provided about stroke risk factors as well as preventing further strokes in the future. Left sitting at EOB with all needs met, son present. Will benefit from skilled neuro OP PT moving forward.     Follow Up Recommendations Outpatient PT ([facility])    Equipment Recommendations  None recommended by PT    Recommendations for Other Services       Precautions / Restrictions Precautions Precautions: Other (comment) Precaution Comments: L UE>LE weakness Restrictions Weight Bearing Restrictions: No      Mobility  Bed Mobility Overal bed mobility: Independent             General bed mobility comments: HOB mildly elevated, no physical assist given    Transfers Overall transfer level: Independent Equipment used: None Transfers: Sit to/from Stand Sit to Stand: Independent         General transfer comment: no physical assist given, good safety awareness/awareness of hand placement  Ambulation/Gait Ambulation/Gait assistance: Supervision Gait Distance (Feet): 160 Feet (80ftx2) Assistive device: None Gait Pattern/deviations: Step-through pattern;Decreased weight shift to left;Trendelenburg;Drifts right/left;Narrow base of support Gait velocity: decreased   General Gait Details: mild functional  weakness and proprioception deficits noted L LE with gait, narrow BOS with occasional scissoring but question if this is baseline as she did not have any balance loss  Stairs Stairs: Yes Stairs assistance: Min guard Stair Management: One rail Left;Forwards;Step to pattern Number of Stairs: 12 General stair comments: able to ascend steps with S/L rail and step over step pattern but did need close min guard and used step to pattern with descent due to weakness L LE  Wheelchair Mobility    Modified Rankin (Stroke Patients Only) Modified Rankin (Stroke Patients Only) Pre-Morbid Rankin Score: No symptoms Modified Rankin: No significant disability     Balance Overall balance assessment: Mild deficits observed, not formally tested                                           Pertinent Vitals/Pain Pain Assessment: No/denies pain    Home Living Family/patient expects to be discharged to:: Private residence Living Arrangements: Spouse/significant other;Children (son and husband) Available Help at Discharge: Family;Available 24 hours/day Type of Home: House Home Access: Stairs to enter Entrance Stairs-Rails: Left Entrance Stairs-Number of Steps: 4 with L ascending rail Home Layout: One level Home Equipment: None Additional Comments: very active gardener at baseline, exercising at the gym doing strength training every day for 40-60 minutes    Prior Function Level of Independence: Independent               Hand Dominance        Extremity/Trunk Assessment   Upper Extremity Assessment Upper Extremity Assessment: Defer to OT evaluation (L UE markedly weaker)      Lower Extremity Assessment Lower Extremity Assessment: RLE deficits/detail;LLE deficits/detail RLE Deficits / Details: WNL RLE Sensation: WNL RLE Coordination: WNL LLE Deficits / Details: L LE ankle DF 5/5, quad 4+/5, hip flexor 3+/5, hip abduction in sitting 4/5 LLE Sensation: decreased  proprioception LLE Coordination: decreased gross motor;decreased fine motor    Cervical / Trunk Assessment Cervical / Trunk Assessment: Normal  Communication   Communication: No difficulties  Cognition Arousal/Alertness: Awake/alert Behavior During Therapy: WFL for tasks assessed/performed Overall Cognitive Status: Within Functional Limits for tasks assessed                                 General Comments: very pleasant and cooperative, son present and interpreted Korean language      General Comments      Exercises     Assessment/Plan    PT Assessment Patient needs continued PT services  PT Problem List Decreased strength;Decreased activity tolerance;Decreased safety awareness;Decreased balance;Decreased mobility;Decreased coordination;Impaired sensation       PT Treatment Interventions DME instruction;Balance training;Gait training;Stair training;Functional mobility training;Neuromuscular re-education;Patient/family education;Therapeutic activities;Therapeutic exercise    PT Goals (Current goals can be found in the Care Plan section)  Acute Rehab PT Goals Patient Stated Goal: get stronger and get back to exercise, recover from CVA PT Goal Formulation: With patient/family Time For Goal Achievement: 05/11/20 Potential to Achieve Goals: Good    Frequency Min 3X/week   Barriers to discharge        Co-evaluation               AM-PAC PT "6 Clicks" Mobility  Outcome Measure Help needed turning from your back to your side while in a flat bed without using bedrails?: None Help needed moving from lying on your back to sitting on the side of a flat bed without using bedrails?: None Help needed moving to and from a bed to a chair (including a wheelchair)?: None Help needed standing up from a chair using your arms (e.g., wheelchair or bedside chair)?: None Help needed to walk in hospital room?: A Little Help needed climbing 3-5 steps with a railing? : A  Little 6 Click Score: 22    End of Session   Activity Tolerance: Patient tolerated treatment well Patient left: in bed;with call bell/phone within reach;with family/visitor present Nurse Communication: Mobility status PT Visit Diagnosis: Unsteadiness on feet (R26.81);Hemiplegia and hemiparesis;Muscle weakness (generalized) (M62.81);Other symptoms and signs involving the nervous system (R29.898) Hemiplegia - Right/Left: Left Hemiplegia - dominant/non-dominant: Non-dominant Hemiplegia - caused by: Cerebral infarction    Time: 0908-0926 PT Time Calculation (min) (ACUTE ONLY): 18 min   Charges:   PT Evaluation $PT Eval Low Complexity: 1 Low          Kristen U PT, DPT, PN1   Supplemental Physical Therapist [place]    Pager [phone] Acute Rehab Office [phone]    

## 2020-04-27 NOTE — Discharge Instructions (Addendum)
Thank you for allowing [place] to care for you during your stay. You were diagnosed with a stroke and had a full workup for this. We would like you to follow up with neurology as well as outpatient physical therapy and a post-hospitalization appointment with your primary care doctor.   Medication Changes Start - Aspirin 81 mg daily            Plavix 75 mg daily for 21 days (Please start tomorrow 04/28/20)            Rosuvastatin (Crestor) 40 mg daily  If you have any questions, please call back, otherwise, please follow up with the providers above.

## 2020-04-27 NOTE — Progress Notes (Signed)
Nsg Discharge Note  Discharge instructions provided to patient and son Greig Castilla who helped translate.   Admit Date:  04/25/2020 Discharge date: 04/27/2020   Baxter Flattery to be D/C'd Home per MD order.  AVS completed.  Copy for chart, and copy for patient signed, and dated. Patient/caregiver able to verbalize understanding.  Discharge Medication: Allergies as of 04/27/2020      Reactions   Sulfa Antibiotics Rash      Medication List    TAKE these medications   amLODipine 5 MG tablet Commonly known as: NORVASC Take 5 mg by mouth daily.   ascorbic acid 100 MG tablet Commonly known as: VITAMIN C Take 100 mg by mouth daily.   aspirin 81 MG chewable tablet Chew 1 tablet (81 mg total) by mouth daily. Start taking on: April 28, 2020   Cholecalciferol 25 MCG (1000 UT) tablet Take 1,000 Units by mouth daily.   clopidogrel 75 MG tablet Commonly known as: Plavix Take 1 tablet (75 mg total) by mouth daily for 21 days. Start taking on: April 28, 2020   losartan 100 MG tablet Commonly known as: COZAAR Take 0.5 tablets (50 mg total) by mouth daily.   rosuvastatin 40 MG tablet Commonly known as: CRESTOR Take 1 tablet (40 mg total) by mouth daily. Start taking on: April 28, 2020       Discharge Assessment: Vitals:   04/27/20 0815 04/27/20 1227  BP: (!) 126/59 113/66  Pulse: (!) 59 (!) 55  Resp: 16 16  Temp: 97.9 F (36.6 C) 98.4 F (36.9 C)  SpO2: 96% 96%   Skin clean, dry and intact without evidence of skin break down, no evidence of skin tears noted. IV catheter discontinued intact. Site without signs and symptoms of complications - no redness or edema noted at insertion site, patient denies c/o pain - only slight tenderness at site.  Dressing with slight pressure applied.  D/c Instructions-Education: Discharge instructions given to patient/family with verbalized understanding. D/c education completed with patient/family including follow up instructions, medication list, d/c  activities limitations if indicated, with other d/c instructions as indicated by MD - patient able to verbalize understanding, all questions fully answered. Patient instructed to return to ED, call 911, or call MD for any changes in condition.  Patient escorted via WC, and D/C home via private auto.  Boykin Nearing, RN 04/27/2020 5:30 PM

## 2020-04-27 NOTE — Progress Notes (Signed)
STROKE TEAM PROGRESS NOTE   INTERVAL HISTORY Patient is laying in bed comfortably, with her son at bedside. Patient and son educated about stroke, stroke risk factors and showing MRI/MRA imaging. All questions were answered.  I have personally reviewed history of presenting illness, electronic medical records and pertinent imaging films in PACS.  She presented with left-sided weakness which appears to be improving MRI scan shows right subcortical lacunar infarct.  Echocardiogram is pending.  Carotid ultrasound showed no significant extracranial stenosis OBJECTIVE   Vitals:   04/26/20 2324 04/27/20 0443 04/27/20 0815 04/27/20 1227  BP: 122/76 132/68 (!) 126/59 113/66  Pulse: 61 64 (!) 59 (!) 55  Resp: 16 16 16 16   Temp: 98.6 F (37 C) 98 F (36.7 C) 97.9 F (36.6 C) 98.4 F (36.9 C)  TempSrc: Oral Oral Oral Oral  SpO2: 96% 96% 96% 96%   Lipid Panel:  Recent Labs  Lab 04/26/20 1909  CHOL 232*  TRIG 116  HDL 55  CHOLHDL 4.2  VLDL 23  LDLCALC 06/24/20*   HgbA1c:  Recent Labs  Lab 04/26/20 0012  HGBA1C 5.2    Pertinent Imaging  04/26/20 CT Head WO Contrast  Normal Head CT   04/26/20 MR Brain WO Contrast  1. Small acute lacunar infarct in the posterior right corona radiata. No associated hemorrhage or mass effect. 2. No other acute intracranial abnormality. Underlying mild to moderate for age subcortical white matter signal changes, nonspecific but most commonly due to chronic small vessel disease.  04/26/20 VAS 06/24/20 Carotid Duplex Bilateral  Right Carotid: There is no evidence of stenosis in the right ICA.  Left Carotid: There is no evidence of stenosis in the left ICA.  Vertebrals: Bilateral vertebral arteries demonstrate antegrade flow.  Subclavians: Normal flow hemodynamics were seen in bilateral subclavian arteries.   04/26/20 Echo Complete WO Imaging Enhancing Agent   04/27/20 MR Angio Head WO Contrast  Negative intracranial MRA.     PHYSICAL EXAM MS: AAOx4, follows  commands  Speech: Fluent with repetition and naming intact  CN: EOMI, VFF, Face symmetric, Tongue midline, Shoulder shrug intact  Motor: 5/5 RUE +4/5 LUE., +4/5 LLE, 5/5 RLE diminished fine finger movements on the left.  Orbits right over left upper extremity.  Mild left grip weakness. Sensation: Intact to light tough throughout  Coordination: Intact FNF bilaterally  Gait: Deferred   ASSESSMENT/PLAN [name] is a 65 y.o. female with PMH significant for HTN who presented with LUE and LLE weakness along with  dizziness and was found to have a small acute right corona radiata ischemic infarction.   #Right Corona Radiata Stroke Her stroke work up is complete at this time. CTH w/NAICP. MRI Brain showed a small acute lacunar infarct in the posterior right corona. Her MRA Head was non revealing for significant stenosis in the anterior circulation. Further vessel imaging with carotid dopplers was negative for stenosis of the right and left ICAs. Echocardiogram revealed Her stroke labs showed LDL 154, A1C 5.2. Stroke etiology is felt to be due to small vessel disease given her HTN and Hyperlipidemia. For secondary stroke prevention she was started on DAPT, Plavix 75 mg + Aspirin 81 mg to be done for 21 days(start 04/28/19 and end 05/19/19).  Followed by aspirin 81 mg daily alone.  She was evaluated by PT and recommended to have outpatient PT. She was educated about stroke, her stroke risk factors and new stroke medications.   #Hypertension She can resume her Amlodipine 5 mg QD and  Losartan 100 mg QD at discharge.   #Hyperlipidemia Her LDL is 154, goal is < 70 from a stroke prevention stand point. Crestor 40 mg QD was started this admission. Lengthy discussion was held regarding a low cholesterol diet with several questions answered.   #Stroke Risk Factors Hyperlipidemia  Hypertension   Hospital day # 1  Ruta Hinds, NP  [facility] Nurse Practitioner Patient seen and discussed with  attending physician Dr. Leonie Man   Neurology will sign off at this time. Recommendations given to primary team. Please contact via Amion with any questions.  I have personally obtained history,examined this patient, reviewed notes, independently viewed imaging studies, participated in medical decision making and plan of care.ROS completed by me personally and pertinent positives fully documented  I have made any additions or clarifications directly to the above note. Agree with note above.  She presented with left hemiparesis due to right subcortical infarct likely from small vessel disease.  Recommend aspirin and Plavix for 3 weeks followed by aspirin alone and aggressive risk factor modification.  Added statin for elevated lipids.  Continue outpatient physical occupational therapy.  Follow-up as an outpatient stroke clinic in 6 weeks.  Long discussion with patient and son and answered questions.  Greater than 50% time during this 35-minute visit was spent on counseling and coordination of care about her lacunar stroke and answering questions about stroke prevention and treatment  Antony Contras, MD Medical Director Echo Pager: [phone] 04/27/2020 4:21 PM   To contact Stroke Continuity provider, please refer to [email or web address] After hours, contact General Neurology

## 2020-04-27 NOTE — Progress Notes (Signed)
HD#1 Subjective:  Overnight Events: No acute events overnight During rounds this morning, patient states she is feeling well overall. Discussed our recommendation to start a statin, and the patient expressed hesitancy due to concern for GI side effects. Counseled patient regarding possible side effects which do not typically include GI side effects. Goal LDL <70 given her stroke (157). She reports she has two siblings who take cholesterol medication. Also discussed the reason for additional imaging with MRA. All questions and concerns were addressed at bedside.  Objective:  Vital signs in last 24 hours: Vitals:   04/26/20 1848 04/26/20 1930 04/26/20 2324 04/27/20 0443  BP: (!) 156/73 (!) 147/74 122/76 132/68  Pulse: (!) 59 (!) 57 61 64  Resp: 14 16 16 16   Temp: 98.7 F (37.1 C) 98.5 F (36.9 C) 98.6 F (37 C) 98 F (36.7 C)  TempSrc: Oral Oral Oral Oral  SpO2: 96% 97% 96% 96%   Supplemental O2: Room Air SpO2: 96 %   Physical Exam:  Physical Exam Vitals and nursing note reviewed.  Constitutional:      General: She is not in acute distress.    Appearance: Normal appearance. She is not ill-appearing, toxic-appearing or diaphoretic.  HENT:     Head: Normocephalic and atraumatic.  Cardiovascular:     Rate and Rhythm: Normal rate and regular rhythm.  Pulmonary:     Effort: Pulmonary effort is normal. No respiratory distress.     Breath sounds: Normal breath sounds.  Abdominal:     General: Abdomen is flat. Bowel sounds are normal.     Palpations: Abdomen is soft.     Tenderness: There is no abdominal tenderness.  Musculoskeletal:     Right lower leg: No edema.     Left lower leg: No edema.  Skin:    General: Skin is warm and dry.  Neurological:     General: No focal deficit present.     Mental Status: She is alert and oriented to person, place, and time.     Comments: LUE and LLE are 4+/5  Psychiatric:        Mood and Affect: Mood normal.        Behavior: Behavior  normal.     There were no vitals filed for this visit.   Intake/Output Summary (Last 24 hours) at 04/27/2020 0723 Last data filed at 04/27/2020 0447 Gross per 24 hour  Intake 720 ml  Output --  Net 720 ml   Net IO Since Admission: 720 mL [04/27/20 0723]  Pertinent Labs: CBC Latest Ref Rng & Units 04/26/2020 04/26/2020 02/27/2016  WBC 4.0 - 10.5 K/uL - 8.3 4.9  Hemoglobin 12.0 - 15.0 g/dL 12.9 12.4 12.0(A)  Hematocrit 36.0 - 46.0 % 38.0 36.3 34.3(A)  Platelets 150 - 400 K/uL - 295 -    CMP Latest Ref Rng & Units 04/27/2020 04/26/2020 04/26/2020  Glucose 70 - 99 mg/dL 92 100(H) 103(H)  BUN 8 - 23 mg/dL 16 16 11   Creatinine 0.44 - 1.00 mg/dL 0.78 0.70 0.72  Sodium 135 - 145 mmol/L 137 143 139  Potassium 3.5 - 5.1 mmol/L 3.4(L) 3.9 3.8  Chloride 98 - 111 mmol/L 106 104 106  CO2 22 - 32 mmol/L 24 - 25  Calcium 8.9 - 10.3 mg/dL 9.2 - 8.9  Total Protein 6.5 - 8.1 g/dL 7.7 - 8.0  Total Bilirubin 0.3 - 1.2 mg/dL 1.1 - 0.8  Alkaline Phos 38 - 126 U/L 97 - 102  AST 15 -  41 U/L 17 - 19  ALT 0 - 44 U/L 18 - 19    Imaging: MR BRAIN WO CONTRAST  Result Date: 04/26/2020 CLINICAL DATA:  65 year old female with left side weakness onset yesterday. EXAM: MRI HEAD WITHOUT CONTRAST TECHNIQUE: Multiplanar, multiecho pulse sequences of the brain and surrounding structures were obtained without intravenous contrast. COMPARISON:  Head CT 0048 hours today. FINDINGS: Brain: Small linear 8 mm area of restricted diffusion in the posterior right corona radiata tracking toward the lentiform (series 5, image 77). Faint FLAIR and T2 hyperintensity. No hemorrhage or mass effect. No other restricted diffusion. No midline shift, mass effect, evidence of mass lesion, ventriculomegaly, extra-axial collection or acute intracranial hemorrhage. Cervicomedullary junction and pituitary are within normal limits. Widely scattered subcortical white matter T2 and FLAIR hyperintensity, mild to moderate for age. No cortical  encephalomalacia or chronic cerebral blood products identified. Deep gray nuclei, brainstem and cerebellum remain within normal limits. Vascular: Major intracranial vascular flow voids are preserved. Skull and upper cervical spine: Congenital incomplete segmentation of C2-C3. Otherwise negative for age. Sinuses/Orbits: Negative. Other: Mastoids are clear. Grossly normal visible internal auditory structures. Negative visible scalp and face soft tissues. IMPRESSION: 1. Small acute lacunar infarct in the posterior right corona radiata. No associated hemorrhage or mass effect. 2. No other acute intracranial abnormality. Underlying mild to moderate for age subcortical white matter signal changes, nonspecific but most commonly due to chronic small vessel disease. Electronically Signed   By: Odessa Fleming M.D.   On: 04/26/2020 09:49   VAS US CAROTID  Result Date: 04/26/2020 Carotid Arterial Duplex Study Indications:       CVA, Numbness and Weakness. Comparison Study:  No previous exam Performing Technologist: Clint Guy RVT  Examination Guidelines: A complete evaluation includes B-mode imaging, spectral Doppler, color Doppler, and power Doppler as needed of all accessible portions of each vessel. Bilateral testing is considered an integral part of a complete examination. Limited examinations for reoccurring indications may be performed as noted.  Right Carotid Findings: +----------+--------+--------+--------+------------------+--------+           PSV cm/sEDV cm/sStenosisPlaque DescriptionComments +----------+--------+--------+--------+------------------+--------+ CCA Prox  65      12                                         +----------+--------+--------+--------+------------------+--------+ CCA Distal69      15                                         +----------+--------+--------+--------+------------------+--------+ ICA Prox  47      13      Normal                              +----------+--------+--------+--------+------------------+--------+ ICA Distal65      19                                         +----------+--------+--------+--------+------------------+--------+ ECA       63      11                                         +----------+--------+--------+--------+------------------+--------+ +----------+--------+-------+--------+-------------------+  PSV cm/sEDV cmsDescribeArm Pressure (mmHG) +----------+--------+-------+--------+-------------------+ [id number]                                         +----------+--------+-------+--------+-------------------+ +---------+--------+--+--------+--+ VertebralPSV cm/s53EDV cm/s18 +---------+--------+--+--------+--+  Left Carotid Findings: +----------+--------+--------+--------+------------------+--------+           PSV cm/sEDV cm/sStenosisPlaque DescriptionComments +----------+--------+--------+--------+------------------+--------+ CCA Prox  99      20                                         +----------+--------+--------+--------+------------------+--------+ CCA Distal71      22                                         +----------+--------+--------+--------+------------------+--------+ ICA Prox  68      21      Normal                             +----------+--------+--------+--------+------------------+--------+ ICA Distal76      25                                         +----------+--------+--------+--------+------------------+--------+ ECA       53      7                                          +----------+--------+--------+--------+------------------+--------+ +----------+--------+--------+--------+-------------------+           PSV cm/sEDV cm/sDescribeArm Pressure (mmHG) +----------+--------+--------+--------+-------------------+ Subclavian120                                          +----------+--------+--------+--------+-------------------+ +---------+--------+--+--------+--+ VertebralPSV cm/s42EDV cm/s12 +---------+--------+--+--------+--+   Summary: Right Carotid: There is no evidence of stenosis in the right ICA. Left Carotid: There is no evidence of stenosis in the left ICA. Vertebrals:  Bilateral vertebral arteries demonstrate antegrade flow. Subclavians: Normal flow hemodynamics were seen in bilateral subclavian              arteries. *See table(s) above for measurements and observations.  Electronically signed by Antony Contras MD on 04/26/2020 at 5:23:08 PM.    Final     Assessment/Plan:   Active Problems:   Stroke [facility])   Patient Summary: Molly Richards is a 64 y.o. 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 Evidence of small lacunar infarct on MRI brain. LUE/LEE weakness improved since exam yesterday. Stroke workup in place and stroke team following, appreciate their input. Pending ECHO results patient otherwise cleared for discharge home with neuro and PT follow up. -stroke work up of:  Bilateral carotid US - no stenosis of either ICA.              Echocardiogram - Report pending  MRA Brain w/o C - No acute infarct             PT/OT/Speech PT recommend outpatient PT and speech recommend regular  diet             HgbA1c - 5.2%             Lipid Panel - LDL 154, Total C of 232 -ASCVD Risk Estimator - 7.0% 10 yr risk -start rosuvastatin 20 mg  -per neuro aspirin and plavix for 21 days, then aspirin monotherapy -patient to follow up with neuro on outpatient basis  #Hx of Hypertension Patient currently outside of permissive hypertension window, will continue home amlodipine and  losartan today -losartan 50 mg tab daily -amlodipine 5 mg tab daily  #Hyperlipidemia -start rosuvastatin 20 mg today  #Hypokalemia K of 3.4, will supplement with oral K today  Diet: NPO VTE: SCDs IVF: None,None Code: Full  Diet: Heart Healthy IVF: None,None VTE: SCDs Code: Full PT/OT recs: Pending,  Dispo: Anticipated discharge to Home in 0-1 days pending echo results  [facility] Resident PGY-1 Pager [phone] Please contact the on call pager after 5 pm and on weekends at [phone].

## 2020-04-28 LAB — ECHOCARDIOGRAM COMPLETE
Area-P 1/2: 2.39 cm2
P 1/2 time: 725 msec
S' Lateral: 2.7 cm

## 2020-04-29 NOTE — Discharge Summary (Signed)
Name: Molly Richards MRN: [id number] DOB: 1955-05-21 65 y.o. PCP: [name], MD  Date of Admission: 04/25/2020 11:46 PM Date of Discharge: 04/27/2020 Attending Physician: Dr. Angelia Mould  Discharge Diagnosis: Active Problems:   Stroke [facility])    Discharge Medications: Allergies as of 04/27/2020      Reactions   Sulfa Antibiotics Rash      Medication List    TAKE these medications   amLODipine 5 MG tablet Commonly known as: NORVASC Take 5 mg by mouth daily.   ascorbic acid 100 MG tablet Commonly known as: VITAMIN C Take 100 mg by mouth daily.   aspirin 81 MG chewable tablet Chew 1 tablet (81 mg total) by mouth daily.   Cholecalciferol 25 MCG (1000 UT) tablet Take 1,000 Units by mouth daily.   clopidogrel 75 MG tablet Commonly known as: Plavix Take 1 tablet (75 mg total) by mouth daily for 21 days.   losartan 100 MG tablet Commonly known as: COZAAR Take 0.5 tablets (50 mg total) by mouth daily.   rosuvastatin 40 MG tablet Commonly known as: CRESTOR Take 1 tablet (40 mg total) by mouth daily.       Disposition and follow-up:   Drummond was discharged from [facility] in good condition.  At the hospital follow up visit please address:  1.  Follow-up:  A. Right Corona Radiata CVA    B. Hyperlipidemia   C. Hypertension  2.  Labs / imaging needed at time of follow-up: recheck lipid panel 4-6 weeks  3.  Pending labs/ test needing follow-up: None  4.  Medication Changes  Started: Rosuvostatin 20 mg daily, Aspirin 81 mg daily,             Plavix for 21 days (end date of 05/20/19)  Stopped: None  Abx - N/A  Follow-up Appointments:  Follow-up Information    [name], MD Follow up in 2 week(s).   Specialty: Family Medicine Why: after discharge Contact information: [place] [id number] [phone]        [facility] Follow up in 4 week(s).   Specialty: Neurology Why: 4 weeks after discharge: stroke  clinic Contact information: [place] Follow up.   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 Since occurring, she felt as though her speech was at baseline and the left sided weakness had improved. She endorsed dizziness prior to the weakness occurring. Physical exam with only neurological abnormalities of -4/5 left upper extremity and left lower extremity strength. ED workup included CT head w/o contrast that was unremarkable and an MRI head w/o contrast that revealed a small acute lacunar infarct in the posterior right corona radiata. She was admitted for further stroke work up and followed by neurology/stroke team. Prior to admission, only risk factor for CVA appeared to be HTN, however she did note normotensive pressures well controlled on losartan and amlodipine.  Upon further evaluation, bilateral carotid US revealed no stenosis. MRA of the head was unremarkable. PT recommended  outpatient PT therapy. Echocardiogram was unremarkable without evidence of embolic source. HgbA1c of 5.2. Lipid panel with LDL of 154 and total cholesterol 232. Patient started on high intensity statin therapy, daily aspirin and to start plavix and stop in 21 days. She has follow up with outpatient PT, [name] neurology, and her primary care provider.  Discharge Vitals:   BP 113/66 (BP Location: Right Arm)   Pulse (!) 55   Temp 98.4 F (36.9 C) (Oral)   Resp 16   SpO2 96%   Pertinent Labs, Studies, and Procedures:  CBC  Latest Ref Rng & Units 04/26/2020 04/26/2020 02/27/2016  WBC 4.0 - 10.5 K/uL - 8.3 4.9  Hemoglobin 12.0 - 15.0 g/dL 12.9 12.4 12.0(A)  Hematocrit 36.0 - 46.0 % 38.0 36.3 34.3(A)  Platelets 150 - 400 K/uL - 295 -    CMP Latest Ref Rng & Units 04/27/2020 04/26/2020 04/26/2020  Glucose 70 - 99 mg/dL 92 100(H) 103(H)  BUN 8 - 23 mg/dL 16 16 11   Creatinine 0.44 - 1.00 mg/dL 0.78 0.70 0.72  Sodium 135 - 145 mmol/L 137 143 139  Potassium 3.5 - 5.1 mmol/L 3.4(L) 3.9 3.8  Chloride 98 - 111 mmol/L 106 104 106  CO2 22 - 32 mmol/L 24 - 25  Calcium 8.9 - 10.3 mg/dL 9.2 - 8.9  Total Protein 6.5 - 8.1 g/dL 7.7 - 8.0  Total Bilirubin 0.3 - 1.2 mg/dL 1.1 - 0.8  Alkaline Phos 38 - 126 U/L 97 - 102  AST 15 - 41 U/L 17 - 19  ALT 0 - 44 U/L 18 - 19   Lab Results  Component Value Date   HGBA1C 5.2 04/26/2020   Lab Results  Component Value Date   CHOL 232 (H) 04/26/2020   HDL 55 04/26/2020   LDLCALC 154 (H) 04/26/2020   TRIG 116 04/26/2020   CHOLHDL 4.2 04/26/2020     CT HEAD WO CONTRAST  Result Date: 04/26/2020 CLINICAL DATA:  Left-sided weakness EXAM: CT HEAD WITHOUT CONTRAST TECHNIQUE: Contiguous axial images were obtained from the base of the skull through the vertex without intravenous contrast. COMPARISON:  None. FINDINGS: Brain: There is no mass, hemorrhage or extra-axial collection. The size and configuration of the ventricles and extra-axial CSF spaces are normal. The brain parenchyma is normal, without acute or chronic infarction. Vascular: No abnormal hyperdensity of the major intracranial arteries or dural venous sinuses. No intracranial atherosclerosis. Skull: The visualized skull base, calvarium and extracranial soft tissues are normal. Sinuses/Orbits: No fluid levels or advanced mucosal thickening of the visualized paranasal sinuses. No mastoid or middle ear effusion. The orbits are normal. IMPRESSION: Normal head CT. Electronically Signed   By: Ulyses Jarred M.D.   On: 04/26/2020 01:07   MR  BRAIN WO CONTRAST  Result Date: 04/26/2020 CLINICAL DATA:  65 year old female with left side weakness onset yesterday. EXAM: MRI HEAD WITHOUT CONTRAST TECHNIQUE: Multiplanar, multiecho pulse sequences of the brain and surrounding structures were obtained without intravenous contrast. COMPARISON:  Head CT 0048 hours today. FINDINGS: Brain: Small linear 8 mm area of restricted diffusion in the posterior right corona radiata tracking toward the lentiform (series 5, image 77). Faint FLAIR and T2 hyperintensity. No hemorrhage or mass effect. No other restricted diffusion. No midline shift, mass effect, evidence of mass lesion, ventriculomegaly, extra-axial collection or acute intracranial hemorrhage. Cervicomedullary junction and pituitary are within normal limits. Widely scattered subcortical white matter T2 and FLAIR hyperintensity, mild to moderate for age. No cortical encephalomalacia or chronic cerebral blood  products identified. Deep gray nuclei, brainstem and cerebellum remain within normal limits. Vascular: Major intracranial vascular flow voids are preserved. Skull and upper cervical spine: Congenital incomplete segmentation of C2-C3. Otherwise negative for age. Sinuses/Orbits: Negative. Other: Mastoids are clear. Grossly normal visible internal auditory structures. Negative visible scalp and face soft tissues. IMPRESSION: 1. Small acute lacunar infarct in the posterior right corona radiata. No associated hemorrhage or mass effect. 2. No other acute intracranial abnormality. Underlying mild to moderate for age subcortical white matter signal changes, nonspecific but most commonly due to chronic small vessel disease. Electronically Signed   By: Genevie Ann M.D.   On: 04/26/2020 09:49   ECHOCARDIOGRAM COMPLETE  Result Date: 04/28/2020    ECHOCARDIOGRAM REPORT   Patient Name:   [facility]   Date of Exam: 04/26/2020 Medical Rec #:  [id number]  Height:       63.0 in Accession #:    [id number] Weight:       128.0 lb Date  of Birth:  [birth year]  BSA:          1.600 m Patient Age:    80 years   BP:           154/86 mmHg Patient Gender: F          HR:           60 bpm. Exam Location:  Inpatient Procedure: 2D Echo Indications:    stroke 434.91  History:        Patient has no prior history of Echocardiogram examinations.  Sonographer:    Johny Chess Referring Phys: [id number] ERIK C HOFFMAN IMPRESSIONS  1. Left ventricular ejection fraction, by estimation, is 60 to 65%. The left ventricle has normal function. The left ventricle has no regional wall motion abnormalities. Left ventricular diastolic parameters were normal.  2. Right ventricular systolic function is normal. The right ventricular size is normal. There is normal pulmonary artery systolic pressure. The estimated right ventricular systolic pressure is 0000000 mmHg.  3. The mitral valve is grossly normal. Trivial mitral valve regurgitation. No evidence of mitral stenosis.  4. The aortic valve is tricuspid. There is mild calcification of the aortic valve. Aortic valve regurgitation is mild. No aortic stenosis is present.  5. The inferior vena cava is normal in size with greater than 50% respiratory variability, suggesting right atrial pressure of 3 mmHg. Conclusion(s)/Recommendation(s): No intracardiac source of embolism detected on this transthoracic study. A transesophageal echocardiogram is recommended to exclude cardiac source of embolism if clinically indicated. FINDINGS  Left Ventricle: Left ventricular ejection fraction, by estimation, is 60 to 65%. The left ventricle has normal function. The left ventricle has no regional wall motion abnormalities. The left ventricular internal cavity size was normal in size. There is  no left ventricular hypertrophy. Left ventricular diastolic parameters were normal. Right Ventricle: The right ventricular size is normal. No increase in right ventricular wall thickness. Right ventricular systolic function is normal. There is normal pulmonary  artery systolic pressure. The tricuspid regurgitant velocity is 2.48 m/s, and  with an assumed right atrial pressure of 3 mmHg, the estimated right ventricular systolic pressure is 0000000 mmHg. Left Atrium: Left atrial size was normal in size. Right Atrium: Right atrial size was normal in size. Pericardium: Trivial pericardial effusion is present. Presence of pericardial fat pad. Mitral Valve: The mitral valve is grossly normal. Trivial mitral valve regurgitation. No evidence of mitral valve stenosis. Tricuspid Valve: The tricuspid valve is grossly normal. Tricuspid valve regurgitation is trivial.  No evidence of tricuspid stenosis. Aortic Valve: The aortic valve is tricuspid. There is mild calcification of the aortic valve. Aortic valve regurgitation is mild. Aortic regurgitation PHT measures 725 msec. No aortic stenosis is present. Pulmonic Valve: The pulmonic valve was grossly normal. Pulmonic valve regurgitation is mild. No evidence of pulmonic stenosis. Aorta: The aortic root and ascending aorta are structurally normal, with no evidence of dilitation. Venous: The inferior vena cava is normal in size with greater than 50% respiratory variability, suggesting right atrial pressure of 3 mmHg. IAS/Shunts: The atrial septum is grossly normal.  LEFT VENTRICLE PLAX 2D LVIDd:         4.30 cm  Diastology LVIDs:         2.70 cm  LV e' medial:    5.55 cm/s LV PW:         1.10 cm  LV E/e' medial:  13.2 LV IVS:        1.00 cm  LV e' lateral:   8.81 cm/s LVOT diam:     1.80 cm  LV E/e' lateral: 8.3 LV SV:         70 LV SV Index:   44 LVOT Area:     2.54 cm  RIGHT VENTRICLE             IVC RV S prime:     14.00 cm/s  IVC diam: 1.00 cm TAPSE (M-mode): 2.7 cm LEFT ATRIUM             Index       RIGHT ATRIUM           Index LA diam:        3.60 cm 2.25 cm/m  RA Area:     12.10 cm LA Vol (A2C):   55.7 ml 34.82 ml/m RA Volume:   24.30 ml  15.19 ml/m LA Vol (A4C):   33.2 ml 20.76 ml/m LA Biplane Vol: 45.2 ml 28.26 ml/m  AORTIC  VALVE LVOT Vmax:   113.00 cm/s LVOT Vmean:  68.200 cm/s LVOT VTI:    0.275 m AI PHT:      725 msec  AORTA Ao Root diam: 3.20 cm Ao Asc diam:  2.80 cm MITRAL VALVE               TRICUSPID VALVE MV Area (PHT): 2.39 cm    TR Peak grad:   24.6 mmHg MV Decel Time: 317 msec    TR Vmax:        248.00 cm/s MV E velocity: 73.30 cm/s MV A velocity: 86.40 cm/s  SHUNTS MV E/A ratio:  0.85        Systemic VTI:  0.28 m                            Systemic Diam: 1.80 cm Eleonore Chiquito MD Electronically signed by Eleonore Chiquito MD Signature Date/Time: 04/28/2020/11:09:08 AM    Final    VAS US CAROTID  Result Date: 04/26/2020 Carotid Arterial Duplex Study Indications:       CVA, Numbness and Weakness. Comparison Study:  No previous exam Performing Technologist: Vonzell Schlatter RVT  Examination Guidelines: A complete evaluation includes B-mode imaging, spectral Doppler, color Doppler, and power Doppler as needed of all accessible portions of each vessel. Bilateral testing is considered an integral part of a complete examination. Limited examinations for reoccurring indications may be performed as noted.  Right Carotid Findings: +----------+--------+--------+--------+------------------+--------+  PSV cm/sEDV cm/sStenosisPlaque DescriptionComments +----------+--------+--------+--------+------------------+--------+ CCA Prox  65      12                                         +----------+--------+--------+--------+------------------+--------+ CCA Distal69      15                                         +----------+--------+--------+--------+------------------+--------+ ICA Prox  47      13      Normal                             +----------+--------+--------+--------+------------------+--------+ ICA Distal65      19                                         +----------+--------+--------+--------+------------------+--------+ ECA       63      11                                          +----------+--------+--------+--------+------------------+--------+ +----------+--------+-------+--------+-------------------+           PSV cm/sEDV cmsDescribeArm Pressure (mmHG) +----------+--------+-------+--------+-------------------+ [id number]                                         +----------+--------+-------+--------+-------------------+ +---------+--------+--+--------+--+ VertebralPSV cm/s53EDV cm/s18 +---------+--------+--+--------+--+  Left Carotid Findings: +----------+--------+--------+--------+------------------+--------+           PSV cm/sEDV cm/sStenosisPlaque DescriptionComments +----------+--------+--------+--------+------------------+--------+ CCA Prox  99      20                                         +----------+--------+--------+--------+------------------+--------+ CCA Distal71      22                                         +----------+--------+--------+--------+------------------+--------+ ICA Prox  68      21      Normal                             +----------+--------+--------+--------+------------------+--------+ ICA Distal76      25                                         +----------+--------+--------+--------+------------------+--------+ ECA       53      7                                          +----------+--------+--------+--------+------------------+--------+ +----------+--------+--------+--------+-------------------+  PSV cm/sEDV cm/sDescribeArm Pressure (mmHG) +----------+--------+--------+--------+-------------------+ Subclavian120                                         +----------+--------+--------+--------+-------------------+ +---------+--------+--+--------+--+ VertebralPSV cm/s42EDV cm/s12 +---------+--------+--+--------+--+   Summary: Right Carotid: There is no evidence of stenosis in the right ICA. Left Carotid: There is no evidence of stenosis in the left ICA. Vertebrals:   Bilateral vertebral arteries demonstrate antegrade flow. Subclavians: Normal flow hemodynamics were seen in bilateral subclavian              arteries. *See table(s) above for measurements and observations.  Electronically signed by Delia Heady MD on 04/26/2020 at 5:23:08 PM.    Final      Discharge Instructions: Discharge Instructions    Ambulatory referral to Physical Therapy   Complete by: As directed    Iontophoresis - 4 mg/ml of dexamethasone: No   T.E.N.S. Unit Evaluation and Dispense as Indicated: No   Call MD for:  difficulty breathing, headache or visual disturbances   Complete by: As directed    Call MD for:  hives   Complete by: As directed    Call MD for:  persistant dizziness or light-headedness   Complete by: As directed    Call MD for:  persistant nausea and vomiting   Complete by: As directed    Call MD for:  redness, tenderness, or signs of infection (pain, swelling, redness, odor or green/yellow discharge around incision site)   Complete by: As directed    Call MD for:  severe uncontrolled pain   Complete by: As directed    Call MD for:  temperature >100.4   Complete by: As directed    Diet - low sodium heart healthy   Complete by: As directed    Increase activity slowly   Complete by: As directed       Signed: Belva Agee, MD 04/29/2020, 7:15 AM   Pager: [phone]

## 2020-05-16 ENCOUNTER — Telehealth: Payer: Self-pay | Admitting: *Deleted

## 2020-05-16 NOTE — Telephone Encounter (Signed)
Follow up stroke sx. Her strength is back to almost normal per. Pt. Injured back due to too much exercise that treated by acupuncture. No c/o voiced

## 2020-06-30 ENCOUNTER — Other Ambulatory Visit: Payer: Self-pay | Admitting: Student

## 2020-07-25 ENCOUNTER — Other Ambulatory Visit: Payer: Self-pay | Admitting: Student

## 2020-07-28 ENCOUNTER — Other Ambulatory Visit: Payer: Self-pay | Admitting: Student

## 2020-08-23 ENCOUNTER — Other Ambulatory Visit: Payer: Self-pay | Admitting: Student

## 2022-02-19 ENCOUNTER — Ambulatory Visit: Payer: BLUE CROSS/BLUE SHIELD

## 2022-07-24 IMAGING — MR MR MRA HEAD W/O CM
1 series · 20 of 48 positions shown · non-contrast
Comparison: None.

CLINICAL DATA: Stroke workup

EXAM:
MRA HEAD WITHOUT CONTRAST
TECHNIQUE: Angiographic images of the Circle of Willis were obtained using MRA
technique without intravenous contrast.

[Series 5: 3d cow · axial · 0.5mm · 0.41mm/px · z∈[-133,-52]mm · 20 of 172 slices shown]
[im 1/172]
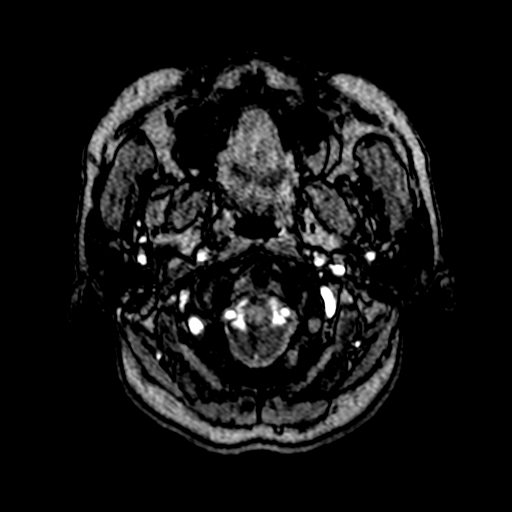
[im 4/172]
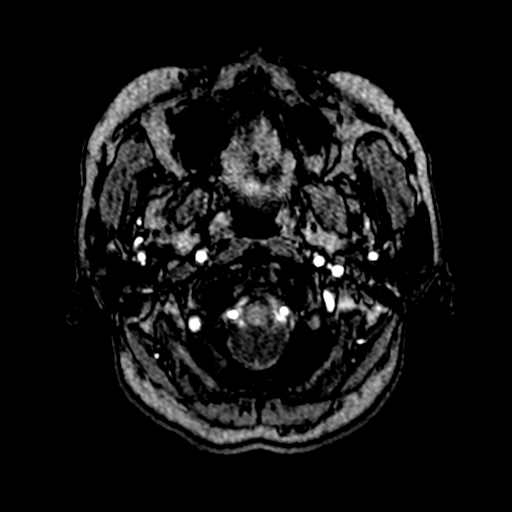
[im 8/172]
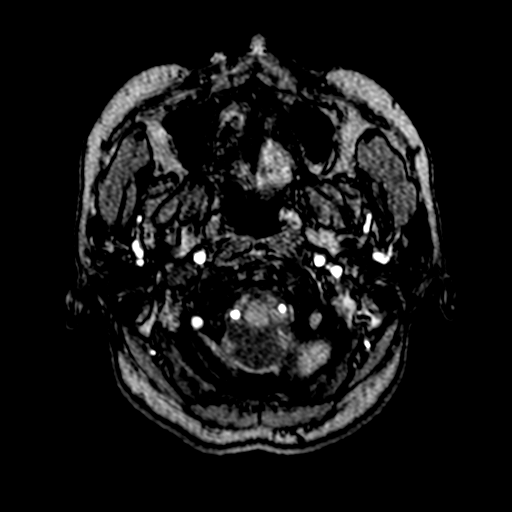
[im 11/172]
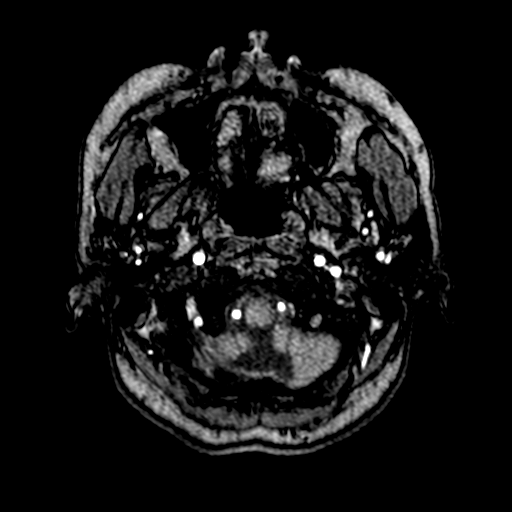
[im 15/172]
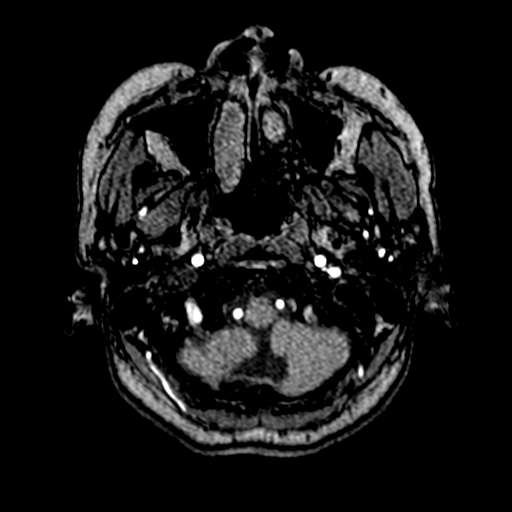
[im 19/172]
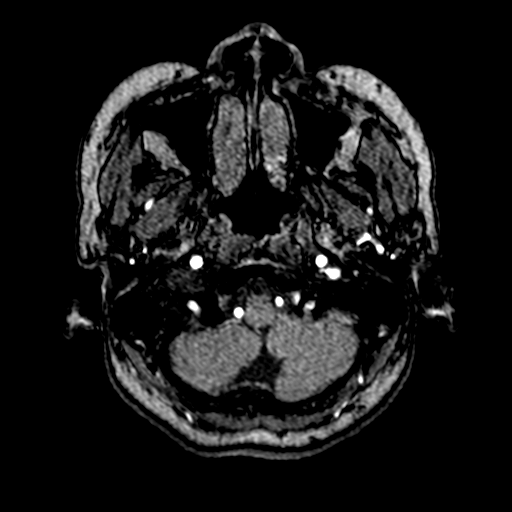
[im 22/172]
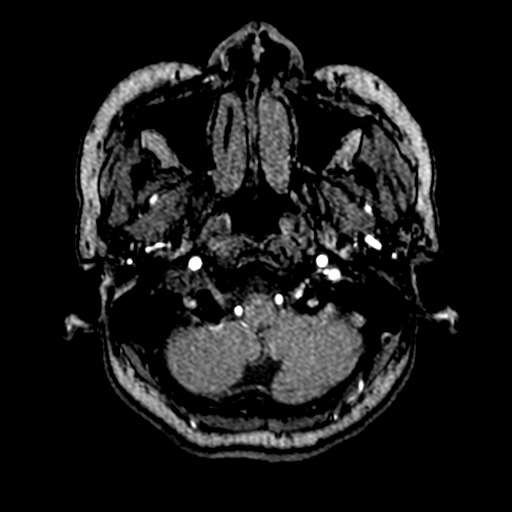
[im 26/172]
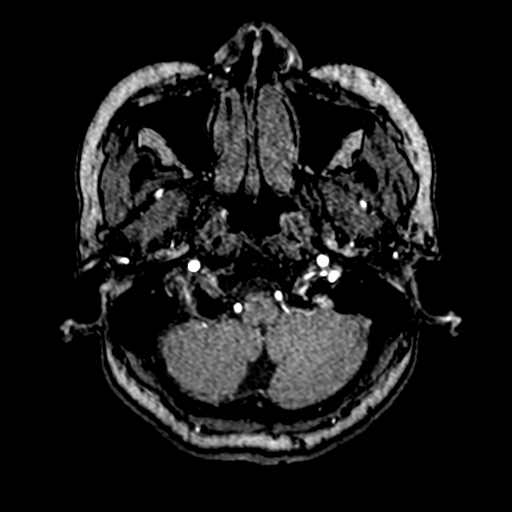
[im 30/172]
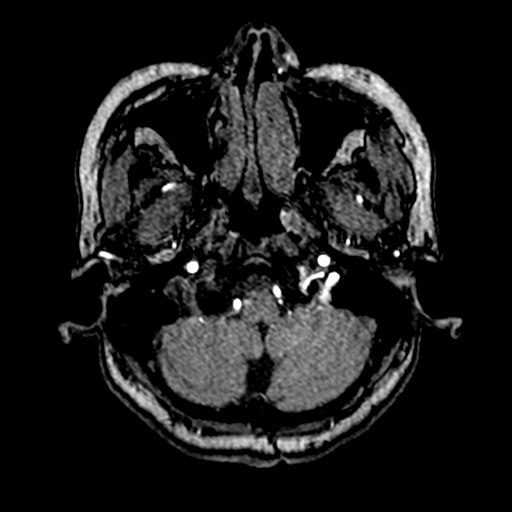
[im 33/172]
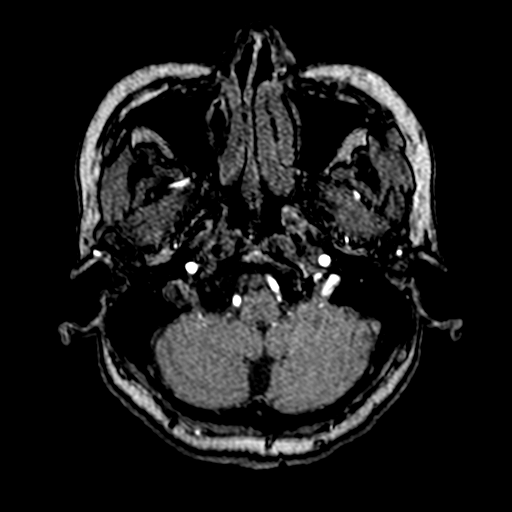
[im 37/172]
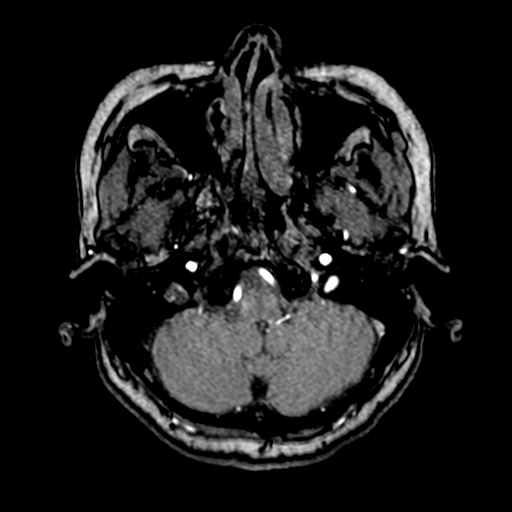
[im 41/172]
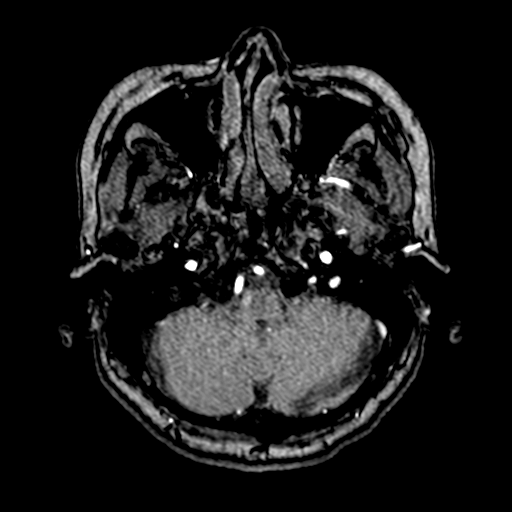
[im 55/172]
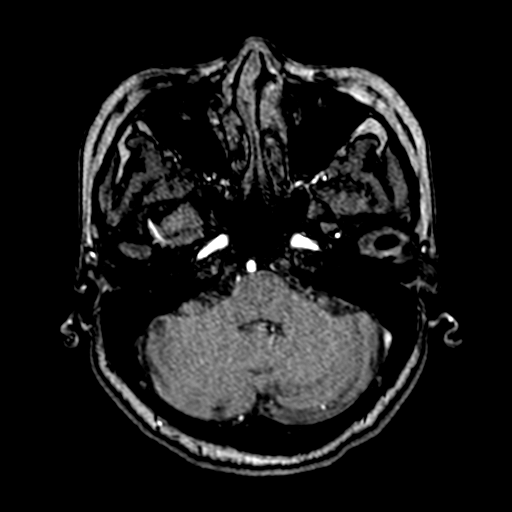
[im 77/172]
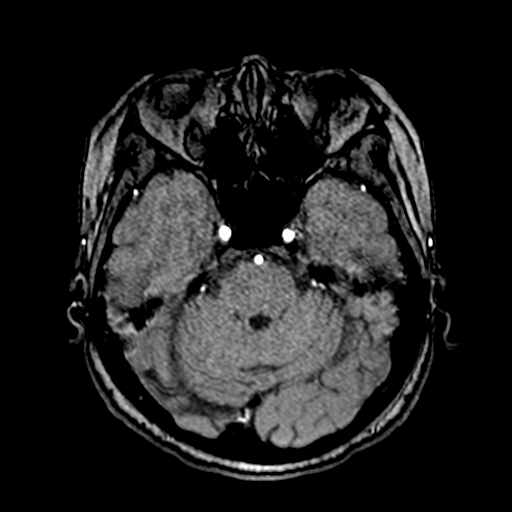
[im 88/172]
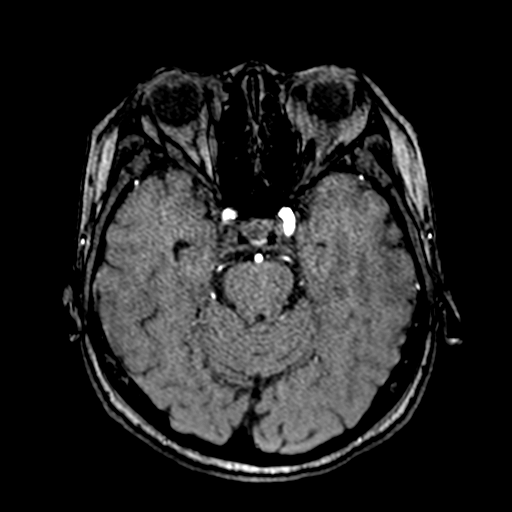
[im 99/172]
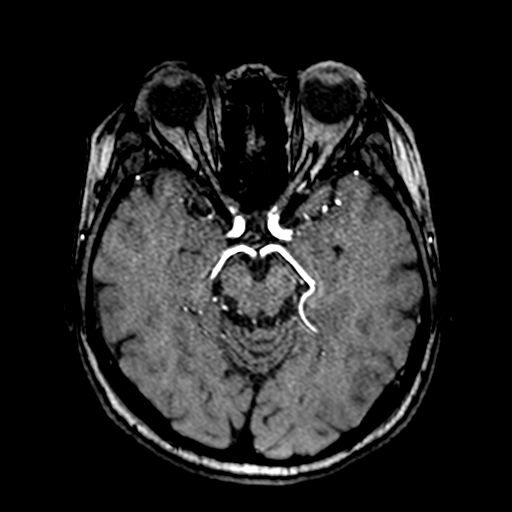
[im 121/172]
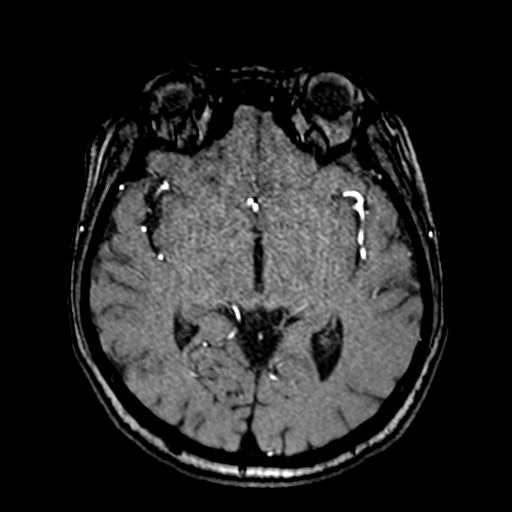
[im 142/172]
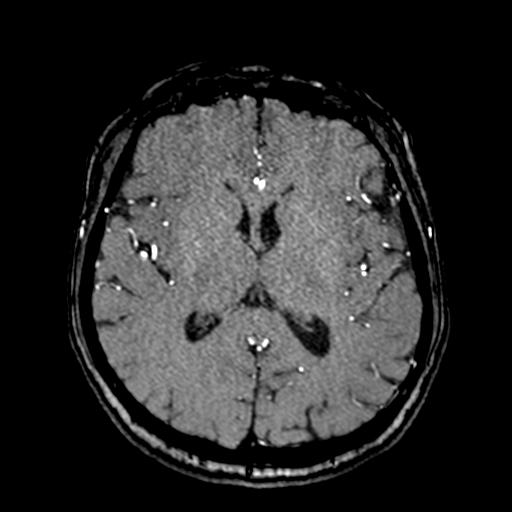
[im 146/172]
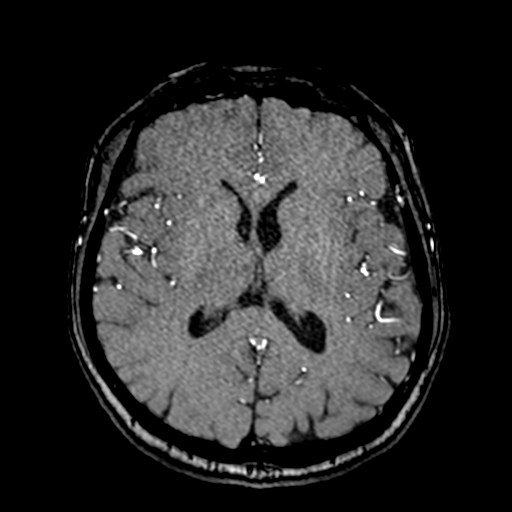
[im 164/172]
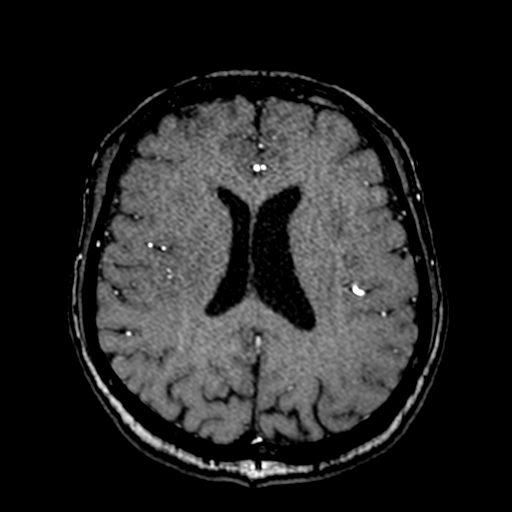

[20 of 48 positions shown; findings below may reference images not displayed]

FINDINGS: Symmetric carotid and vertebral arteries with unremarkable
branching. No beading, flow reducing stenosis, aneurysm, or visible
vascular malformation.
IMPRESSION: Negative intracranial MRA.
